# Patient Record
Sex: Male | Born: 1935 | Race: White | Hispanic: No | Marital: Married | Smoking: Former smoker
Health system: Southern US, Community
[De-identification: ages and names within clinical notes are randomized; demographics above are authoritative.]

## PROBLEM LIST (undated history)

## (undated) DIAGNOSIS — Z8673 Personal history of transient ischemic attack (TIA), and cerebral infarction without residual deficits: Secondary | ICD-10-CM

## (undated) DIAGNOSIS — I10 Essential (primary) hypertension: Secondary | ICD-10-CM

## (undated) DIAGNOSIS — I480 Paroxysmal atrial fibrillation: Secondary | ICD-10-CM

## (undated) DIAGNOSIS — I639 Cerebral infarction, unspecified: Secondary | ICD-10-CM

## (undated) DIAGNOSIS — M353 Polymyalgia rheumatica: Secondary | ICD-10-CM

## (undated) DIAGNOSIS — E785 Hyperlipidemia, unspecified: Secondary | ICD-10-CM

## (undated) DIAGNOSIS — S72001A Fracture of unspecified part of neck of right femur, initial encounter for closed fracture: Secondary | ICD-10-CM

## (undated) DIAGNOSIS — E876 Hypokalemia: Secondary | ICD-10-CM

## (undated) DIAGNOSIS — R296 Repeated falls: Secondary | ICD-10-CM

## (undated) DIAGNOSIS — I4891 Unspecified atrial fibrillation: Secondary | ICD-10-CM

## (undated) HISTORY — DX: Fracture of unspecified part of neck of right femur, initial encounter for closed fracture: S72.001A

## (undated) HISTORY — DX: Paroxysmal atrial fibrillation: I48.0

## (undated) HISTORY — DX: Hypokalemia: E87.6

## (undated) HISTORY — DX: Cerebral infarction, unspecified: I63.9

## (undated) HISTORY — PX: HEMORRHOID SURGERY: SHX153

## (undated) HISTORY — DX: Essential (primary) hypertension: I10

## (undated) HISTORY — DX: Repeated falls: R29.6

## (undated) HISTORY — DX: Unspecified atrial fibrillation: I48.91

## (undated) HISTORY — DX: Personal history of transient ischemic attack (TIA), and cerebral infarction without residual deficits: Z86.73

## (undated) HISTORY — DX: Polymyalgia rheumatica: M35.3

## (undated) HISTORY — DX: Hyperlipidemia, unspecified: E78.5

---

## 1998-11-01 ENCOUNTER — Other Ambulatory Visit: Admission: RE | Admit: 1998-11-01 | Discharge: 1998-11-01 | Payer: Self-pay | Admitting: Gastroenterology

## 1999-05-12 ENCOUNTER — Encounter: Payer: Self-pay | Admitting: Urology

## 1999-05-12 ENCOUNTER — Ambulatory Visit (HOSPITAL_COMMUNITY): Admission: RE | Admit: 1999-05-12 | Discharge: 1999-05-12 | Payer: Self-pay | Admitting: Urology

## 2007-01-28 ENCOUNTER — Ambulatory Visit: Payer: Self-pay | Admitting: Gastroenterology

## 2007-03-07 ENCOUNTER — Ambulatory Visit: Payer: Self-pay | Admitting: Gastroenterology

## 2007-03-07 ENCOUNTER — Encounter: Payer: Self-pay | Admitting: Gastroenterology

## 2007-05-15 ENCOUNTER — Ambulatory Visit: Payer: Self-pay | Admitting: Gastroenterology

## 2007-05-15 LAB — CONVERTED CEMR LAB
Fecal Occult Blood: NEGATIVE
OCCULT 2: NEGATIVE
OCCULT 4: NEGATIVE
OCCULT 5: NEGATIVE

## 2007-11-30 DIAGNOSIS — Z9889 Other specified postprocedural states: Secondary | ICD-10-CM | POA: Insufficient documentation

## 2007-11-30 DIAGNOSIS — I6350 Cerebral infarction due to unspecified occlusion or stenosis of unspecified cerebral artery: Secondary | ICD-10-CM

## 2007-11-30 DIAGNOSIS — D126 Benign neoplasm of colon, unspecified: Secondary | ICD-10-CM | POA: Insufficient documentation

## 2007-11-30 DIAGNOSIS — I10 Essential (primary) hypertension: Secondary | ICD-10-CM

## 2007-11-30 DIAGNOSIS — K5730 Diverticulosis of large intestine without perforation or abscess without bleeding: Secondary | ICD-10-CM | POA: Insufficient documentation

## 2007-11-30 DIAGNOSIS — M1990 Unspecified osteoarthritis, unspecified site: Secondary | ICD-10-CM | POA: Insufficient documentation

## 2007-11-30 DIAGNOSIS — G459 Transient cerebral ischemic attack, unspecified: Secondary | ICD-10-CM | POA: Insufficient documentation

## 2007-11-30 DIAGNOSIS — Z87442 Personal history of urinary calculi: Secondary | ICD-10-CM | POA: Insufficient documentation

## 2007-11-30 DIAGNOSIS — Z8711 Personal history of peptic ulcer disease: Secondary | ICD-10-CM

## 2007-11-30 DIAGNOSIS — Z9089 Acquired absence of other organs: Secondary | ICD-10-CM | POA: Insufficient documentation

## 2009-03-15 ENCOUNTER — Inpatient Hospital Stay (HOSPITAL_COMMUNITY): Admission: RE | Admit: 2009-03-15 | Discharge: 2009-03-18 | Payer: Self-pay | Admitting: Orthopedic Surgery

## 2009-04-15 ENCOUNTER — Encounter: Admission: RE | Admit: 2009-04-15 | Discharge: 2009-05-11 | Payer: Self-pay | Admitting: Orthopedic Surgery

## 2009-08-07 HISTORY — PX: TOTAL KNEE ARTHROPLASTY: SHX125

## 2009-08-30 ENCOUNTER — Ambulatory Visit: Payer: Self-pay | Admitting: Vascular Surgery

## 2009-08-30 ENCOUNTER — Encounter (INDEPENDENT_AMBULATORY_CARE_PROVIDER_SITE_OTHER): Payer: Self-pay | Admitting: Internal Medicine

## 2009-08-30 ENCOUNTER — Inpatient Hospital Stay (HOSPITAL_COMMUNITY): Admission: EM | Admit: 2009-08-30 | Discharge: 2009-09-01 | Payer: Self-pay | Admitting: Emergency Medicine

## 2009-08-31 ENCOUNTER — Encounter (INDEPENDENT_AMBULATORY_CARE_PROVIDER_SITE_OTHER): Payer: Self-pay | Admitting: Internal Medicine

## 2009-10-18 ENCOUNTER — Encounter: Admission: RE | Admit: 2009-10-18 | Discharge: 2010-01-16 | Payer: Self-pay | Admitting: Obstetrics and Gynecology

## 2009-11-03 ENCOUNTER — Ambulatory Visit (HOSPITAL_COMMUNITY): Admission: RE | Admit: 2009-11-03 | Discharge: 2009-11-03 | Payer: Self-pay | Admitting: Family Medicine

## 2010-02-14 ENCOUNTER — Encounter (INDEPENDENT_AMBULATORY_CARE_PROVIDER_SITE_OTHER): Payer: Self-pay | Admitting: *Deleted

## 2010-03-14 ENCOUNTER — Encounter: Admission: RE | Admit: 2010-03-14 | Discharge: 2010-04-04 | Payer: Self-pay | Admitting: Obstetrics and Gynecology

## 2010-03-24 ENCOUNTER — Encounter: Payer: Self-pay | Admitting: Emergency Medicine

## 2010-03-24 ENCOUNTER — Inpatient Hospital Stay (HOSPITAL_COMMUNITY): Admission: AD | Admit: 2010-03-24 | Discharge: 2010-03-29 | Payer: Self-pay | Admitting: Neurology

## 2010-03-28 ENCOUNTER — Ambulatory Visit: Payer: Self-pay | Admitting: Physical Medicine & Rehabilitation

## 2010-05-23 ENCOUNTER — Encounter: Admission: RE | Admit: 2010-05-23 | Discharge: 2010-05-23 | Payer: Self-pay | Admitting: Neurology

## 2010-09-06 NOTE — Letter (Signed)
Summary: Endoscopy- Changed to Office Visit  [facility]  [address] [place], [place] [address]   Phone: [phone]  Fax: [phone]      February 14, 2010 MRN: [id number]   Frank Horn [address] [place], [place]  [address]   Dear [name],   According to our records, it is time for you to schedule an Endoscopy. However, after reviewing your medical record, I feel that an office visit would be most appropriate to more completely evaluate you and determine your need for a repeat procedure.  Please call [phone] (option #2) at your convenience to schedule an office visit. If you have any questions, concerns, or feel that this letter is in error, we would appreciate your call.   Sincerely,  Barbette Hair. Arlyce Dice, M.D.  [facility] [phone]

## 2010-10-21 LAB — CK TOTAL AND CKMB (NOT AT ARMC): Relative Index: INVALID (ref 0.0–2.5)

## 2010-10-21 LAB — GLUCOSE, CAPILLARY
Glucose-Capillary: 102 mg/dL — ABNORMAL HIGH (ref 70–99)
Glucose-Capillary: 109 mg/dL — ABNORMAL HIGH (ref 70–99)
Glucose-Capillary: 109 mg/dL — ABNORMAL HIGH (ref 70–99)
Glucose-Capillary: 114 mg/dL — ABNORMAL HIGH (ref 70–99)
Glucose-Capillary: 115 mg/dL — ABNORMAL HIGH (ref 70–99)
Glucose-Capillary: 120 mg/dL — ABNORMAL HIGH (ref 70–99)
Glucose-Capillary: 147 mg/dL — ABNORMAL HIGH (ref 70–99)
Glucose-Capillary: 90 mg/dL (ref 70–99)
Glucose-Capillary: 93 mg/dL (ref 70–99)
Glucose-Capillary: 95 mg/dL (ref 70–99)

## 2010-10-21 LAB — COMPREHENSIVE METABOLIC PANEL
Alkaline Phosphatase: 67 U/L (ref 39–117)
BUN: 10 mg/dL (ref 6–23)
CO2: 33 mEq/L — ABNORMAL HIGH (ref 19–32)
Calcium: 9.4 mg/dL (ref 8.4–10.5)
Creatinine, Ser: 0.96 mg/dL (ref 0.4–1.5)
GFR calc Af Amer: >60 mL/min (ref >60)
Glucose, Bld: 87 mg/dL (ref 70–99)
Sodium: 143 mEq/L (ref 135–145)
Total Bilirubin: 0.7 mg/dL (ref 0.3–1.2)

## 2010-10-21 LAB — PROTIME-INR: Prothrombin Time: 13.7 seconds (ref 11.6–15.2)

## 2010-10-21 LAB — DIFFERENTIAL
Basophils Absolute: 0 10*3/uL (ref 0.0–0.1)
Eosinophils Absolute: 0.1 10*3/uL (ref 0.0–0.7)
Eosinophils Relative: 3 % (ref 0–5)
Monocytes Absolute: 0.4 10*3/uL (ref 0.1–1.0)
Monocytes Relative: 8 % (ref 3–12)
Neutrophils Relative %: 66 % (ref 43–77)

## 2010-10-21 LAB — CBC
MCH: 32.3 pg (ref 26.0–34.0)
MCHC: 35.3 g/dL (ref 30.0–36.0)
Platelets: 182 10*3/uL (ref 150–400)
WBC: 5.3 10*3/uL (ref 4.0–10.5)

## 2010-10-21 LAB — URINALYSIS, ROUTINE W REFLEX MICROSCOPIC: Specific Gravity, Urine: 1.01 (ref 1.005–1.030)

## 2010-10-21 LAB — APTT: aPTT: 39 seconds — ABNORMAL HIGH (ref 24–37)

## 2010-10-21 LAB — OSMOLALITY: Osmolality: 290 mOsm/kg (ref 275–300)

## 2010-10-21 LAB — TROPONIN I: Troponin I: 0.01 ng/mL (ref 0.00–0.06)

## 2010-10-21 LAB — MRSA PCR SCREENING: MRSA by PCR: NEGATIVE

## 2010-10-23 LAB — POCT I-STAT, CHEM 8
BUN: 12 mg/dL (ref 6–23)
Chloride: 108 mEq/L (ref 96–112)
Creatinine, Ser: 0.7 mg/dL (ref 0.4–1.5)
Potassium: 3.8 mEq/L (ref 3.5–5.1)
Sodium: 142 mEq/L (ref 135–145)

## 2010-10-23 LAB — TSH
TSH: 2.244 u[IU]/mL (ref 0.350–4.500)
TSH: 2.837 u[IU]/mL (ref 0.350–4.500)

## 2010-10-23 LAB — CBC
HCT: 38.2 % — ABNORMAL LOW (ref 39.0–52.0)
HCT: 40.7 % (ref 39.0–52.0)
Hemoglobin: 12.9 g/dL — ABNORMAL LOW (ref 13.0–17.0)
Hemoglobin: 13.8 g/dL (ref 13.0–17.0)
MCV: 90 fL (ref 78.0–100.0)
RBC: 4.24 MIL/uL (ref 4.22–5.81)
RDW: 14.1 % (ref 11.5–15.5)
WBC: 5 10*3/uL (ref 4.0–10.5)

## 2010-10-23 LAB — DIFFERENTIAL
Basophils Absolute: 0 10*3/uL (ref 0.0–0.1)
Basophils Absolute: 0 10*3/uL (ref 0.0–0.1)
Basophils Relative: 0 % (ref 0–1)
Eosinophils Relative: 3 % (ref 0–5)
Eosinophils Relative: 3 % (ref 0–5)
Lymphocytes Relative: 25 % (ref 12–46)
Lymphocytes Relative: 28 % (ref 12–46)
Lymphs Abs: 1.3 10*3/uL (ref 0.7–4.0)
Monocytes Absolute: 0.4 10*3/uL (ref 0.1–1.0)
Monocytes Relative: 8 % (ref 3–12)
Neutro Abs: 3.1 10*3/uL (ref 1.7–7.7)

## 2010-10-23 LAB — HEMOGLOBIN A1C: Hgb A1c MFr Bld: 5.5 % (ref 4.6–6.1)

## 2010-10-23 LAB — COMPREHENSIVE METABOLIC PANEL
Alkaline Phosphatase: 72 U/L (ref 39–117)
BUN: 10 mg/dL (ref 6–23)
CO2: 27 mEq/L (ref 19–32)
Chloride: 108 mEq/L (ref 96–112)
Creatinine, Ser: 0.9 mg/dL (ref 0.4–1.5)
GFR calc non Af Amer: >60 mL/min (ref >60)
Potassium: 3.6 mEq/L (ref 3.5–5.1)
Total Bilirubin: 0.9 mg/dL (ref 0.3–1.2)

## 2010-10-23 LAB — LIPID PANEL
Triglycerides: 280 mg/dL — ABNORMAL HIGH (ref <150)
VLDL: 56 mg/dL — ABNORMAL HIGH (ref 0–40)

## 2010-11-12 LAB — CBC
HCT: 27.3 % — ABNORMAL LOW (ref 39.0–52.0)
HCT: 32.1 % — ABNORMAL LOW (ref 39.0–52.0)
Hemoglobin: 11 g/dL — ABNORMAL LOW (ref 13.0–17.0)
MCHC: 34.3 g/dL (ref 30.0–36.0)
MCHC: 34.3 g/dL (ref 30.0–36.0)
MCHC: 34.4 g/dL (ref 30.0–36.0)
MCHC: 34.6 g/dL (ref 30.0–36.0)
MCV: 90.5 fL (ref 78.0–100.0)
MCV: 90.5 fL (ref 78.0–100.0)
Platelets: 159 10*3/uL (ref 150–400)
RBC: 3.17 MIL/uL — ABNORMAL LOW (ref 4.22–5.81)
RBC: 4.49 MIL/uL (ref 4.22–5.81)
RDW: 14 % (ref 11.5–15.5)
RDW: 14.2 % (ref 11.5–15.5)
RDW: 14.2 % (ref 11.5–15.5)

## 2010-11-12 LAB — URINALYSIS, ROUTINE W REFLEX MICROSCOPIC
Bilirubin Urine: NEGATIVE
Ketones, ur: NEGATIVE mg/dL
Nitrite: NEGATIVE
Protein, ur: NEGATIVE mg/dL
Urobilinogen, UA: 0.2 mg/dL (ref 0.0–1.0)

## 2010-11-12 LAB — BASIC METABOLIC PANEL
BUN: 9 mg/dL (ref 6–23)
CO2: 27 mEq/L (ref 19–32)
CO2: 30 mEq/L (ref 19–32)
CO2: 30 mEq/L (ref 19–32)
CO2: 31 mEq/L (ref 19–32)
Calcium: 9.4 mg/dL (ref 8.4–10.5)
Chloride: 104 mEq/L (ref 96–112)
Chloride: 104 mEq/L (ref 96–112)
Creatinine, Ser: 0.82 mg/dL (ref 0.4–1.5)
Creatinine, Ser: 0.97 mg/dL (ref 0.4–1.5)
GFR calc Af Amer: >60 mL/min (ref >60)
GFR calc non Af Amer: >60 mL/min (ref >60)
GFR calc non Af Amer: >60 mL/min (ref >60)
Glucose, Bld: 123 mg/dL — ABNORMAL HIGH (ref 70–99)
Glucose, Bld: 146 mg/dL — ABNORMAL HIGH (ref 70–99)
Potassium: 3.5 mEq/L (ref 3.5–5.1)
Potassium: 3.9 mEq/L (ref 3.5–5.1)
Sodium: 140 mEq/L (ref 135–145)

## 2010-11-12 LAB — PROTIME-INR
INR: 1.1 (ref 0.00–1.49)
Prothrombin Time: 19.8 seconds — ABNORMAL HIGH (ref 11.6–15.2)

## 2010-11-12 LAB — DIFFERENTIAL
Basophils Absolute: 0 10*3/uL (ref 0.0–0.1)
Basophils Relative: 0 % (ref 0–1)
Neutro Abs: 3.9 10*3/uL (ref 1.7–7.7)
Neutrophils Relative %: 67 % (ref 43–77)

## 2010-11-12 LAB — APTT: aPTT: 34 seconds (ref 24–37)

## 2010-12-20 NOTE — Discharge Summary (Signed)
NAMEBURDETT, PINZON               ACCOUNT NO.:  [id number]   MEDICAL RECORD NO.:  [id number]          PATIENT TYPE:  INP   LOCATION:  1615                         FACILITY:  [facility]   PHYSICIAN:  Almedia Balls. Ranell Patrick, M.D. DATE OF BIRTH:  Dec 12, 1926   DATE OF ADMISSION:  03/15/2009  DATE OF DISCHARGE:  03/18/2009                               DISCHARGE SUMMARY   ADMISSION DIAGNOSIS:  Right knee end-stage osteoarthritis.   DISCHARGE DIAGNOSIS:  Right knee end-stage osteoarthritis, status post  total knee arthroplasty.   BRIEF HISTORY:  The patient is an 77 year old male with worsening right  knee pain secondary to osteoarthritis.  Patient has elected for surgical  management for improved function and decreased pain.   PROCEDURE:  The patient had a right total knee arthroplasty by Dr.  Malon Kindle on March 15, 2009.  Assistant was [employer], PA-C.  No  complications, and estimated blood loss was minimal.   HOSPITAL COURSE:  The patient was admitted on March 15, 2009, for the  above-stated procedure which he tolerated well.  After adequate time in  post-anesthesia care unit, he was transferred up to [facility].  On  postoperative day #1, the patient complained about minimal pain and  soreness to the right lower extremity.  He was doing quite well.  He was  able to work with physical therapy quite well, had one episode of  vertigo because he moved too quickly, but blood counts were 9.8 and 28  at the time.  Neurovascularly, he was intact.  He continued to work  quite well with physical therapy until discharge date on August 12.   DISCHARGE PLAN:  The patient is being discharged to skilled nursing  facility on March 18, 2009.  His condition is stable.  His diet is  regular.  The patient has no known drug allergies.   DISCHARGE MEDICATIONS:  1. Aspirin 81 mg p.o. daily.  2. Zocor 20 mg daily.  3. Coumadin per pharmacy protocol.  4. Norco 5/325 one to 2 tabs q.4-6 h. p.r.n. pain.  5.  Robaxin 500 mg q.8 h.   FOLLOW UP:  The patient will follow back up with Dr. Malon Kindle in 2  weeks.     Thomas B. Durwin Nora, P.A.      Almedia Balls. Ranell Patrick, M.D.  Electronically Signed   TBD/MEDQ  D:  03/18/2009  T:  03/18/2009  Job:  [id number]

## 2010-12-20 NOTE — Op Note (Signed)
Frank Horn, Frank Horn               ACCOUNT NO.:  [id number]   MEDICAL RECORD NO.:  [id number]          PATIENT TYPE:  INP   LOCATION:  0007                         FACILITY:  [facility]   PHYSICIAN:  Almedia Balls. Ranell Patrick, M.D. DATE OF BIRTH:  11/14/1926   DATE OF PROCEDURE:  03/15/2009  DATE OF DISCHARGE:                               OPERATIVE REPORT   PREOPERATIVE DIAGNOSIS:  Right knee end-stage osteoarthritis.   POSTOPERATIVE DIAGNOSIS:  Right knee end-stage osteoarthritis.   PROCEDURE PERFORMED:  Right total knee replacement.   ATTENDING SURGEON:  Dr. Beverely Low.   ASSISTANT:  Modesto Charon, [place].   ANESTHESIA:  General.   ESTIMATED BLOOD LOSS:  Minimal.   TOURNIQUET TIME:  One hour and 30 minutes at 300 mmHg.   FLUID REPLACEMENT:  1600 mL of crystalloid.   URINE OUTPUT:  650 mL.   INSTRUMENT COUNT:  Correct.   COMPLICATIONS:  There were no complications.   Perioperative antibiotics were given.   INDICATIONS:  The patient is an 77 year old male with a history of  worsening right knee pain secondary to disabling osteoarthritis.  The  patient has had progressive pain despite surgical management.  The  patient presents now having failed conservative management desiring  operative treatment to restore function and eliminate pain.  Informed  consent obtained.   DESCRIPTION OF PROCEDURE:  After an adequate level of anesthesia was  achieved, the patient was positioned supine on the operating room table.  A nonsterile tourniquet was placed on the right proximal thigh.  The  right leg was sterilely prepped and draped in the usual manner.  The  patient had significant varus and a slight flexion fracture to his knee.  After sterile prep and drape of the leg, we elevated the tourniquet to  300 mmHg after exsanguination with an Esmarch bandage.  We then flexed  the knee and incised the knee in the midline using a 10 blade scalpel.  Dissection down through the subcutaneous  tissues using the scalpel.  A  fresh blade was utilized for the mini parapatellar arthrotomy.  Lateral  patellofemoral ligaments divided and the patella everted.  At this  point, we went ahead and entered the distal femur using a step-cut  drill.  We then resected 10 mm off the affected right knee set on 5  degrees right.  We then sized the femur to a size 3.  We then made our  anterior, posterior and chamfer cuts for the size 3 component.  We then  subluxed the tibia anteriorly, removed the remaining PCL, ACL tissue, as  well as meniscal tissue and then resected the tibia 90 degrees  perpendicular to the long axis of the tibia with a 0 posterior slope.  At this point with the tibial resection performed, we checked our  extension and flexion gaps which were symmetric at 12.5 mm.  We then  went ahead and removed excess posterior bone off the posterior femoral  condyles using three-quarter inch curved osteotomes.  We removed soft  tissue off from the margins of the joint to make sure that nothing would  pinch.  We then went ahead with sizing for the tibia, which was a size 4  after removal of osteophytes and then performed our modular drill and  keel punch for final tibial preparation.  We also did our box cut for  the posterior cruciate substituting prosthesis for the femur and then  went ahead and impacted trial components into place.  We used a 15  insert initially, that later grew to a 17.5 after real components were  in and we trialed again.  With the 15 polyethylene insert in, we went  ahead and placed the knee in extension.  We then went ahead and  resurfaced the patella going from 25-26 mm thickness down to 16 with the  patellar cutting guide and then used our lug drill to drill the lugs for  the patellar button.  We used a 38 button, reduced the knee and had  excellent soft tissue balancing and normal patellar tracking.  We were  stable in flexion/extension.  At this point, we went  ahead and removed  all trial components.  We plugged the end of the femur using available  bone to decrease bleeding.  We then pulse irrigated the entire knee and  then cemented the components into place with a third-generation vacuum  mixing cement techniques.  Once the cement was allowed to harden, we  removed excess cement using quarter-inch curved osteotome.  Polyethylene  trial insert had been placed in the knee and the knee placed in  extension for setting of the cement, as well as the patella being  clamped.  At this point with the excess cement removed, we trialed again  with the 15 and then a 17.5, and the 17.5 was able to achieve full  extension and we felt like that would provide overall better stability,  especially in deep flexion.  Thus, we selected a 17.5 insert and placed  the real insert.  We checked one more time for any excess cement and  none was encountered.  We pulse irrigated again and placed a 17.5  insert.  We then closed the medial parapatellar arthrotomy using #1  Vicryl suture figure-of-eight followed by 0 Vicryl and 2-0 Vicryl  layered subcutaneous closure and a 4-0 running Monocryl for skin.  Steri-  Strips were applied followed by a sterile dressing.  The patient  tolerated the surgery well.      Almedia Balls. Ranell Patrick, M.D.  Electronically Signed     SRN/MEDQ  D:  03/15/2009  T:  03/15/2009  Job:  [id number]

## 2010-12-20 NOTE — Assessment & Plan Note (Signed)
[facility]                         GASTROENTEROLOGY OFFICE NOTE   Frank, Horn                      MRN:          [id number]  DATE:01/28/2007                            DOB:          [birth year]    REASON FOR CONSULTATION:  Heme-positive stool.   Frank Horn is a pleasant 77 year old white male referred through the  courtesy of Dr. Tenny Craw for evaluation.  Routine testing demonstrated heme-  positive stool.  Frank Horn has no GI complaints including change of  bowel habits, abdominal pain, melena or hematochezia.  He is on no  gastric irritants including nonsteroidals.  He has a history of colon  polyps, diverticulosis, and a remote history of bleeding peptic ulcer  disease.  Last colonoscopy in 2004 demonstrated diverticulosis.  He has  a history of kidney stones.  He is status post hemorrhoidectomy in 1963.  CBC on Dec 26, 2006 demonstrated hemoglobin 14.5.   FAMILY HISTORY:  Noncontributory.   PAST MEDICAL HISTORY:  Also pertinent for TIAs for which he is on  Plavix.   MEDICATIONS:  Baby aspirin, Plavix, and Lovastatin.   ALLERGIES:  He has no allergies.   SOCIAL HISTORY:  He is an ex-smoker.  He does not drink.  He is married  and retired.   REVIEW OF SYSTEMS:  Positive for back pain.   PHYSICAL EXAMINATION:  VITAL SIGNS:  Pulse 60, blood pressure 120/76,  weight 156.  HEENT:  EOMI. PERRLA. Sclerae are anicteric.  Conjunctivae are pink.  NECK:  Supple without thyromegaly, adenopathy or carotid bruits.  CHEST:  Clear to auscultation and percussion without adventitious  sounds.  CARDIAC:  Regular rhythm; normal S1, S2.  There are no murmurs, gallops  or rubs.  ABDOMEN:  Bowel sounds are normoactive.  Abdomen is soft, nontender and  nondistended.  There are no abdominal masses, tenderness, splenic  enlargement or hepatomegaly.  EXTREMITIES:  Full range of motion.  No cyanosis, clubbing or edema.  RECTAL:  Deferred.  The exam is  normal.   IMPRESSION:  1. Heme-positive stool.  We are asked to rule out colonic bleeding      sources including polyps, arteriovenous malformations, and      neoplasm.  2. History of colon polyps.  3. Diverticulosis.  4. History of transient ischemic attacks - on Plavix.   RECOMMENDATION:  Colonoscopy.  If negative I will obtain followup  Hemoccults.     Barbette Hair. Arlyce Dice, MD,FACG  Electronically Signed    RDK/MedQ  DD: 01/28/2007  DT: 01/28/2007  Job #: [id number]   cc:   C. Duane Lope, M.D.

## 2010-12-23 NOTE — H&P (Signed)
Frank Horn, KRIESEL               ACCOUNT NO.:  [id number]   MEDICAL RECORD NO.:  [id number]          PATIENT TYPE:  INP   LOCATION:                               FACILITY:  [facility]   PHYSICIAN:  Almedia Balls. Ranell Patrick, M.D. DATE OF BIRTH:  04-14-1927   DATE OF ADMISSION:  03/15/2009  DATE OF DISCHARGE:                              HISTORY & PHYSICAL   CHIEF COMPLAINT:  Right knee pain.   HISTORY OF PRESENT ILLNESS:  The patient is an 77 year old male with  worsening right knee pain that has been refractory for conservative  treatment.  The patient elected for surgical management for that right  knee.   PAST MEDICAL HISTORY:  1. Vertigo.  2. Hyperlipidemia.  3. BPH.   FAMILY MEDICAL HISTORY:  Negative.   SOCIAL HISTORY:  Patient of Dr. Miguel Aschoff.  Does not smoke or use  alcohol.  Is retired.   ALLERGIES:  NONE.   MEDICATIONS:  1. Aspirin 81 mg daily.  2. Pravastatin 40 mg daily.  3. Plavix 1.75 mg daily.  4. Vitamins.   REVIEW OF SYSTEMS:  Pain with range of motion.   PHYSICAL EXAMINATION:  VITAL SIGNS:  Pulse 68, respirations 16, blood  pressure 122/74.  GENERAL:  The patient is a healthy-appearing 77 year old male in no  acute distress.  Pleasant mood and affect.  Alert and oriented x3.  HEENT:  Shows cranial nerves II-XII grossly intact with no tenderness of  cervical, thoracic or lumbar spine.  CHEST:  Active breath sounds bilaterally.  No wheezes, rhonchi or rales.  ABDOMEN:  Nontender, nondistended with active bowel sounds.  EXTREMITIES:  Moderate tenderness to the right knee, especially in the  medial joint line with crepitus with a varus alignment, but  neurovascularly, otherwise, he is intact distally.  No pedal edema.  Does have a normal heel-toe gait.   STUDIES:  X-rays show end-stage osteoarthritis right knee.   IMPRESSION:  End-stage osteoarthritis right knee.   PLAN OF ACTION:  Right total knee arthroplasty by Dr. Malon Kindle.      Thomas B. Durwin Nora,  P.A.      Almedia Balls. Ranell Patrick, M.D.  Electronically Signed    TBD/MEDQ  D:  02/24/2009  T:  02/24/2009  Job:  [id number]

## 2012-04-09 ENCOUNTER — Encounter: Payer: Self-pay | Admitting: Gastroenterology

## 2014-05-29 ENCOUNTER — Other Ambulatory Visit: Payer: Self-pay

## 2014-05-29 ENCOUNTER — Encounter: Payer: Self-pay | Admitting: Vascular Surgery

## 2014-05-29 DIAGNOSIS — M79606 Pain in leg, unspecified: Secondary | ICD-10-CM

## 2014-06-25 ENCOUNTER — Encounter: Payer: Self-pay | Admitting: Vascular Surgery

## 2014-06-26 ENCOUNTER — Encounter: Payer: Self-pay | Admitting: Vascular Surgery

## 2014-06-26 ENCOUNTER — Other Ambulatory Visit: Payer: Self-pay | Admitting: Vascular Surgery

## 2014-06-26 ENCOUNTER — Ambulatory Visit (INDEPENDENT_AMBULATORY_CARE_PROVIDER_SITE_OTHER): Payer: Medicare Other | Admitting: Vascular Surgery

## 2014-06-26 ENCOUNTER — Ambulatory Visit (HOSPITAL_COMMUNITY)
Admission: RE | Admit: 2014-06-26 | Discharge: 2014-06-26 | Disposition: A | Discharge status: Home / Self Care | Payer: Medicare Other | Source: Ambulatory Visit | Attending: Vascular Surgery | Admitting: Vascular Surgery

## 2014-06-26 VITALS — BP 177/104 | HR 78 | Resp 16 | Ht 66.5 in | Wt 149.0 lb

## 2014-06-26 DIAGNOSIS — M79604 Pain in right leg: Secondary | ICD-10-CM

## 2014-06-26 DIAGNOSIS — Z87891 Personal history of nicotine dependence: Secondary | ICD-10-CM | POA: Insufficient documentation

## 2014-06-26 DIAGNOSIS — M79605 Pain in left leg: Secondary | ICD-10-CM

## 2014-06-26 DIAGNOSIS — M79606 Pain in leg, unspecified: Secondary | ICD-10-CM

## 2014-06-26 DIAGNOSIS — I10 Essential (primary) hypertension: Secondary | ICD-10-CM | POA: Insufficient documentation

## 2014-06-26 DIAGNOSIS — I70219 Atherosclerosis of native arteries of extremities with intermittent claudication, unspecified extremity: Secondary | ICD-10-CM | POA: Insufficient documentation

## 2014-06-26 DIAGNOSIS — I739 Peripheral vascular disease, unspecified: Secondary | ICD-10-CM

## 2014-06-26 DIAGNOSIS — E785 Hyperlipidemia, unspecified: Secondary | ICD-10-CM | POA: Diagnosis not present

## 2014-06-26 NOTE — Progress Notes (Signed)
Referred by:  Melinda Crutch, MD [place],  [id number]  Reason for referral: bilateral leg pain  History of Present Illness  Frank Horn is a 80 y.o. (1926-12-01) male who presents with chief complaint: recent onset of bilateral knee pain.  Onset of symptom occurred few months ago, without any obvious trigger.  Pain is described as burning sensation, severity 3-6/10, without ambulation relationship.  He wakes up in the night time with this neuropathic pain in the knee.  Patient has attempted to treat this pain with rest.  The patient has no rest pain symptoms also and no leg wounds/ulcers.  Atherosclerotic risk factors include: HLD, HTN, and prior smoking.  Past Medical History  Diagnosis Date  . Hypertension   . Hyperlipidemia   . Stroke   . Atrial fibrillation   . Polymyalgia     resolved    Past Surgical History  Procedure Laterality Date  . Hemorrhoid surgery    . Total knee arthroplasty Right 2011    History   Social History  . Marital Status: Married    Spouse Name: N/A    Number of Children: N/A  . Years of Education: N/A   Occupational History  . Not on file.   Social History Main Topics  . Smoking status: Former Smoker    Types: Cigarettes    Quit date: 06/26/2009  . Smokeless tobacco: Never Used  . Alcohol Use: 0.0 oz/week    0 Not specified per week  . Drug Use: No  . Sexual Activity: Not on file   Other Topics Concern  . Not on file   Social History Narrative    Family History  Problem Relation Age of Onset  . Heart disease Mother   . Aneurysm Brother     Current Outpatient Prescriptions on File Prior to Visit  Medication Sig Dispense Refill  . aspirin 81 MG tablet Take 81 mg by mouth daily.    Mariane Baumgarten Sodium (COLACE PO) Take 100 mg by mouth as needed.    . IBUPROFEN PO Take 1 tablet by mouth as needed.    . Multiple Vitamin (MULTIVITAMIN) tablet Take 1 tablet by mouth daily.    . pravastatin (PRAVACHOL) 80 MG  tablet Take 80 mg by mouth daily.    . ramipril (ALTACE) 2.5 MG capsule Take 2.5 mg by mouth daily.     No current facility-administered medications on file prior to visit.    Allergies  Allergen Reactions  . Lipitor [Atorvastatin]     myalgias    REVIEW OF SYSTEMS:  (Positives checked otherwise negative)  CARDIOVASCULAR:  [ ]  chest pain, [ ]  chest pressure, [ ]  palpitations, [ ]  shortness of breath when laying flat, [ ]  shortness of breath with exertion,   [ ]  pain in feet when walking, [ ]  pain in feet when laying flat, [ ]  history of blood clot in veins (DVT), [ ]  history of phlebitis, [ ]  swelling in legs, [ ]  varicose veins  PULMONARY:  [ ]  productive cough, [ ]  asthma, [ ]  wheezing  NEUROLOGIC:  [ ]  weakness in arms or legs, [ ]  numbness in arms or legs, [ ]  difficulty speaking or slurred speech, [ ]  temporary loss of vision in one eye, [ ]  dizziness  HEMATOLOGIC:  [ ]  bleeding problems, [ ]  problems with blood clotting too easily  MUSCULOSKEL:  [ ]  joint pain, [ ]  joint swelling  GASTROINTEST:  [ ]   Vomiting  blood, [ ]   Blood in stool     GENITOURINARY:  [ ]   Burning with urination, [ ]   Blood in urine  PSYCHIATRIC:  [ ]  history of major depression  INTEGUMENTARY:  [ ]  rashes, [ ]  ulcers  CONSTITUTIONAL:  [ ]  fever, [ ]  chills  For VQI Use Only   PRE-ADM LIVING: Home  AMB STATUS: Ambulatory  CAD Sx: None  PRIOR CHF: None  STRESS TEST: [x]  No, [ ]  Normal, [ ]  + ischemia, [ ]  + MI, [ ]  Both  Physical Examination Filed Vitals:   06/26/14 1118  BP: 177/104  Pulse: 78  Resp: 16  Height: 5' 6.5" (1.689 m)  Weight: 149 lb (67.586 kg)   Body mass index is 23.69 kg/(m^2).  General: A&O x 3, WDWN, elderly  Head: [place]/AT  Ear/Nose/Throat: Hearing grossly intact, nares w/o erythema or drainage, oropharynx w/o Erythema/Exudate  Eyes: PERRLA, EOMI  Neck: Supple, no nuchal rigidity, no palpable LAD  Pulmonary: Sym exp, good air movt, CTAB, no rales, rhonchi,  & wheezing  Cardiac: RRR, Nl S1, S2, no Murmurs, rubs or gallops  Vascular: Vessel Right Left  Radial Palpable Palpable  Brachial  Palpable Palpable  Carotid Palpable, without bruit Palpable, without bruit  Aorta Not palpable N/A  Femoral Palpable Palpable  Popliteal Not palpable Not palpable  PT Faintly Palpable Faintly Palpable  DP Palpable Palpable   Gastrointestinal: soft, NTND, -G/R, - HSM, - masses, - CVAT B  Musculoskeletal: M/S 5/5 throughout , Extremities without ischemic changes , B cyanotic feet which resolve with elevation  Neurologic: CN 2-12 intact , Pain and light touch intact in extremities , Motor exam as listed above  Psychiatric: Judgment intact, Mood & affect appropriatefor pt's clinical situation  Dermatologic: See M/S exam for extremity exam, no rashes otherwise noted  Lymph : No Cervical, Axillary, or Inguinal lymphadenopathy   Non-Invasive Vascular Imaging  ABI (Date: 06/26/2014)  R: 0.76, DP: tri, PT: mono, TBI: 0.76  L: 0.90, DP: tri, PT: bi, TBI: 0.90  Outside Studies/Documentation 2 pages of outside documents were reviewed including: outpatient PCP chart.  Medical Decision Making  Frank Horn is a 80 y.o. male who presents with: likely neuropathic pain, mild LLE PAD, R mild-mod PAD, likely chronic venous insufficiency    Pt's sx are not typical for PAD. I doubt PAD is responsible his sx though he does has some degree of PAD.  His TBI are normal so at the toe level his flow is adequate.  No intervention is likely to be necessary in this patient.    Pt's ABI put him in the intermittent claudication range.  I discussed with the patient the natural history of intermittent claudication: 75% of patients have stable or improved symptoms in a year an only 2% require amputation. Eventually 20% may require intervention in a year.  I discussed in depth with the patient the nature of atherosclerosis, and emphasized the importance of maximal medical  management including strict control of blood pressure, blood glucose, and lipid levels, antiplatelet agent, obtaining regular exercise, and cessation of smoking.    The patient is aware that without maximal medical management the underlying atherosclerotic disease process will progress, limiting the benefit of any interventions.  I discussed in depth with the patient a walking plan and how to execute such. The patient is currently on a statin: due to myalgias. The patient is currently on an anti-platelet: ASA. This pateitn can follow up with [place] as needed.  Thank  you for allowing [place] to participate in this patient's care.  Adele Barthel, MD [facility] Office: [phone] Pager: [phone]  06/26/2014, 11:49 AM

## 2016-11-05 DIAGNOSIS — S72001A Fracture of unspecified part of neck of right femur, initial encounter for closed fracture: Secondary | ICD-10-CM

## 2016-11-05 HISTORY — DX: Fracture of unspecified part of neck of right femur, initial encounter for closed fracture: S72.001A

## 2016-11-18 ENCOUNTER — Encounter (HOSPITAL_COMMUNITY): Payer: Self-pay | Admitting: Emergency Medicine

## 2016-11-18 ENCOUNTER — Emergency Department (HOSPITAL_COMMUNITY): Payer: Medicare Other

## 2016-11-18 ENCOUNTER — Inpatient Hospital Stay (HOSPITAL_COMMUNITY): Payer: Medicare Other | Admitting: Anesthesiology

## 2016-11-18 ENCOUNTER — Inpatient Hospital Stay (HOSPITAL_COMMUNITY): Payer: Medicare Other

## 2016-11-18 ENCOUNTER — Inpatient Hospital Stay (HOSPITAL_COMMUNITY)
Admission: EM | Admit: 2016-11-18 | Discharge: 2016-11-20 | DRG: 470 | Disposition: A | Discharge status: Skilled Nursing Facility | Payer: Medicare Other | Attending: Internal Medicine | Admitting: Internal Medicine

## 2016-11-18 ENCOUNTER — Encounter (HOSPITAL_COMMUNITY)
Admission: EM | Disposition: A | Discharge status: Skilled Nursing Facility | Payer: Self-pay | Source: Home / Self Care | Attending: Internal Medicine

## 2016-11-18 DIAGNOSIS — Z8673 Personal history of transient ischemic attack (TIA), and cerebral infarction without residual deficits: Secondary | ICD-10-CM | POA: Diagnosis not present

## 2016-11-18 DIAGNOSIS — Z7982 Long term (current) use of aspirin: Secondary | ICD-10-CM | POA: Diagnosis not present

## 2016-11-18 DIAGNOSIS — F0390 Unspecified dementia without behavioral disturbance: Secondary | ICD-10-CM | POA: Diagnosis present

## 2016-11-18 DIAGNOSIS — Z79899 Other long term (current) drug therapy: Secondary | ICD-10-CM | POA: Diagnosis not present

## 2016-11-18 DIAGNOSIS — G9389 Other specified disorders of brain: Secondary | ICD-10-CM | POA: Diagnosis present

## 2016-11-18 DIAGNOSIS — I480 Paroxysmal atrial fibrillation: Secondary | ICD-10-CM

## 2016-11-18 DIAGNOSIS — M353 Polymyalgia rheumatica: Secondary | ICD-10-CM | POA: Diagnosis present

## 2016-11-18 DIAGNOSIS — W19XXXA Unspecified fall, initial encounter: Secondary | ICD-10-CM | POA: Diagnosis not present

## 2016-11-18 DIAGNOSIS — D62 Acute posthemorrhagic anemia: Secondary | ICD-10-CM | POA: Diagnosis not present

## 2016-11-18 DIAGNOSIS — S72031A Displaced midcervical fracture of right femur, initial encounter for closed fracture: Secondary | ICD-10-CM | POA: Diagnosis present

## 2016-11-18 DIAGNOSIS — Z87891 Personal history of nicotine dependence: Secondary | ICD-10-CM | POA: Diagnosis not present

## 2016-11-18 DIAGNOSIS — E876 Hypokalemia: Secondary | ICD-10-CM

## 2016-11-18 DIAGNOSIS — Z419 Encounter for procedure for purposes other than remedying health state, unspecified: Secondary | ICD-10-CM | POA: Diagnosis not present

## 2016-11-18 DIAGNOSIS — E785 Hyperlipidemia, unspecified: Secondary | ICD-10-CM | POA: Diagnosis present

## 2016-11-18 DIAGNOSIS — S72001A Fracture of unspecified part of neck of right femur, initial encounter for closed fracture: Secondary | ICD-10-CM | POA: Diagnosis present

## 2016-11-18 DIAGNOSIS — Z96641 Presence of right artificial hip joint: Secondary | ICD-10-CM

## 2016-11-18 DIAGNOSIS — W010XXA Fall on same level from slipping, tripping and stumbling without subsequent striking against object, initial encounter: Secondary | ICD-10-CM | POA: Diagnosis present

## 2016-11-18 DIAGNOSIS — Z96651 Presence of right artificial knee joint: Secondary | ICD-10-CM | POA: Diagnosis present

## 2016-11-18 DIAGNOSIS — R296 Repeated falls: Secondary | ICD-10-CM | POA: Diagnosis not present

## 2016-11-18 DIAGNOSIS — Y92009 Unspecified place in unspecified non-institutional (private) residence as the place of occurrence of the external cause: Secondary | ICD-10-CM

## 2016-11-18 DIAGNOSIS — R740 Nonspecific elevation of levels of transaminase and lactic acid dehydrogenase [LDH]: Secondary | ICD-10-CM | POA: Diagnosis not present

## 2016-11-18 DIAGNOSIS — I10 Essential (primary) hypertension: Secondary | ICD-10-CM | POA: Diagnosis present

## 2016-11-18 DIAGNOSIS — Z66 Do not resuscitate: Secondary | ICD-10-CM | POA: Diagnosis present

## 2016-11-18 DIAGNOSIS — M25551 Pain in right hip: Secondary | ICD-10-CM | POA: Diagnosis present

## 2016-11-18 HISTORY — PX: TOTAL HIP ARTHROPLASTY: SHX124

## 2016-11-18 LAB — CBC WITH DIFFERENTIAL/PLATELET
Basophils Absolute: 0 10*3/uL (ref 0.0–0.1)
Basophils Relative: 0 %
EOS PCT: 0 %
Eosinophils Absolute: 0 10*3/uL (ref 0.0–0.7)
HEMATOCRIT: 37.2 % — AB (ref 39.0–52.0)
Hemoglobin: 12.6 g/dL — ABNORMAL LOW (ref 13.0–17.0)
LYMPHS ABS: 0.6 10*3/uL — AB (ref 0.7–4.0)
LYMPHS PCT: 7 %
MCH: 29.6 pg (ref 26.0–34.0)
MCHC: 33.9 g/dL (ref 30.0–36.0)
MCV: 87.5 fL (ref 78.0–100.0)
MONO ABS: 0.6 10*3/uL (ref 0.1–1.0)
Monocytes Relative: 7 %
NEUTROS ABS: 7.4 10*3/uL (ref 1.7–7.7)
Neutrophils Relative %: 86 %
PLATELETS: 168 10*3/uL (ref 150–400)
RBC: 4.25 MIL/uL (ref 4.22–5.81)
RDW: 13.8 % (ref 11.5–15.5)
WBC: 8.5 10*3/uL (ref 4.0–10.5)

## 2016-11-18 LAB — TYPE AND SCREEN
ABO/RH(D): O POS
ANTIBODY SCREEN: NEGATIVE

## 2016-11-18 LAB — BASIC METABOLIC PANEL
ANION GAP: 7 (ref 5–15)
BUN: 13 mg/dL (ref 6–20)
CHLORIDE: 105 mmol/L (ref 101–111)
CO2: 28 mmol/L (ref 22–32)
Calcium: 9.3 mg/dL (ref 8.9–10.3)
Creatinine, Ser: 0.89 mg/dL (ref 0.61–1.24)
GFR calc Af Amer: >60 mL/min (ref >60)
GFR calc non Af Amer: >60 mL/min (ref >60)
Glucose, Bld: 133 mg/dL — ABNORMAL HIGH (ref 65–99)
POTASSIUM: 3.4 mmol/L — AB (ref 3.5–5.1)
Sodium: 140 mmol/L (ref 135–145)

## 2016-11-18 LAB — PROTIME-INR
INR: 1.19
Prothrombin Time: 15.1 seconds (ref 11.4–15.2)

## 2016-11-18 SURGERY — ARTHROPLASTY, HIP, TOTAL, ANTERIOR APPROACH
Anesthesia: General | Site: Hip | Laterality: Right

## 2016-11-18 MED ORDER — ACETAMINOPHEN 650 MG RE SUPP: 650.0000 mg | Freq: Four times a day (QID) | RECTAL | Status: DC | PRN

## 2016-11-18 MED ORDER — MENTHOL 3 MG MT LOZG
1.0000 | LOZENGE | OROMUCOSAL | Status: DC | PRN
  Filled 2016-11-18: qty 9

## 2016-11-18 MED ORDER — ALUM & MAG HYDROXIDE-SIMETH 200-200-20 MG/5ML PO SUSP: 30.0000 mL | ORAL | Status: DC | PRN

## 2016-11-18 MED ORDER — HEPARIN SODIUM (PORCINE) 5000 UNIT/ML IJ SOLN: 5000.0000 [IU] | Freq: Three times a day (TID) | INTRAMUSCULAR | Status: DC

## 2016-11-18 MED ORDER — LIDOCAINE 2% (20 MG/ML) 5 ML SYRINGE
INTRAMUSCULAR | Status: DC | PRN
  Administered 2016-11-18: 40 mg via INTRAVENOUS

## 2016-11-18 MED ORDER — ROCURONIUM BROMIDE 10 MG/ML (PF) SYRINGE
PREFILLED_SYRINGE | INTRAVENOUS | Status: DC | PRN
  Administered 2016-11-18: 50 mg via INTRAVENOUS

## 2016-11-18 MED ORDER — SODIUM CHLORIDE 0.9 % IV SOLN
INTRAVENOUS | Status: DC
  Administered 2016-11-18 – 2016-11-20 (×4): via INTRAVENOUS

## 2016-11-18 MED ORDER — PHENOL 1.4 % MT LIQD
1.0000 | OROMUCOSAL | Status: DC | PRN
  Filled 2016-11-18: qty 177

## 2016-11-18 MED ORDER — SUGAMMADEX SODIUM 200 MG/2ML IV SOLN
INTRAVENOUS | Status: DC | PRN
  Administered 2016-11-18: 150 mg via INTRAVENOUS

## 2016-11-18 MED ORDER — ASPIRIN EC 325 MG PO TBEC
325.0000 mg | DELAYED_RELEASE_TABLET | Freq: Every day | ORAL | Status: DC
  Administered 2016-11-19 – 2016-11-20 (×2): 325 mg via ORAL
  Filled 2016-11-18 (×2): qty 1

## 2016-11-18 MED ORDER — MORPHINE SULFATE (PF) 2 MG/ML IV SOLN
0.5000 mg | INTRAVENOUS | Status: DC | PRN
  Administered 2016-11-18 – 2016-11-20 (×9): 0.5 mg via INTRAVENOUS
  Filled 2016-11-18 (×9): qty 1

## 2016-11-18 MED ORDER — MIDAZOLAM HCL 2 MG/2ML IJ SOLN
INTRAMUSCULAR | Status: AC
  Filled 2016-11-18: qty 2

## 2016-11-18 MED ORDER — STERILE WATER FOR IRRIGATION IR SOLN
Status: DC | PRN
  Administered 2016-11-18: 2000 mL

## 2016-11-18 MED ORDER — DOCUSATE SODIUM 100 MG PO CAPS
100.0000 mg | ORAL_CAPSULE | Freq: Two times a day (BID) | ORAL | Status: DC
  Administered 2016-11-18 – 2016-11-20 (×4): 100 mg via ORAL
  Filled 2016-11-18 (×4): qty 1

## 2016-11-18 MED ORDER — CEFAZOLIN SODIUM-DEXTROSE 2-4 GM/100ML-% IV SOLN
2.0000 g | Freq: Four times a day (QID) | INTRAVENOUS | Status: AC
  Administered 2016-11-18 – 2016-11-19 (×2): 2 g via INTRAVENOUS
  Filled 2016-11-18 (×2): qty 100

## 2016-11-18 MED ORDER — ROCURONIUM BROMIDE 50 MG/5ML IV SOSY
PREFILLED_SYRINGE | INTRAVENOUS | Status: AC
  Filled 2016-11-18: qty 5

## 2016-11-18 MED ORDER — ONDANSETRON HCL 4 MG PO TABS: 4.0000 mg | ORAL_TABLET | Freq: Four times a day (QID) | ORAL | Status: DC | PRN

## 2016-11-18 MED ORDER — SODIUM CHLORIDE 0.9 % IR SOLN
Status: DC | PRN
  Administered 2016-11-18: 1000 mL

## 2016-11-18 MED ORDER — CEFAZOLIN SODIUM-DEXTROSE 2-4 GM/100ML-% IV SOLN
INTRAVENOUS | Status: AC
  Filled 2016-11-18: qty 100

## 2016-11-18 MED ORDER — PHENYLEPHRINE 40 MCG/ML (10ML) SYRINGE FOR IV PUSH (FOR BLOOD PRESSURE SUPPORT)
PREFILLED_SYRINGE | INTRAVENOUS | Status: DC | PRN
  Administered 2016-11-18 (×2): 80 ug via INTRAVENOUS
  Administered 2016-11-18: 160 ug via INTRAVENOUS
  Administered 2016-11-18: 80 ug via INTRAVENOUS

## 2016-11-18 MED ORDER — ONDANSETRON HCL 4 MG/2ML IJ SOLN
INTRAMUSCULAR | Status: DC | PRN
  Administered 2016-11-18: 4 mg via INTRAVENOUS

## 2016-11-18 MED ORDER — ACETAMINOPHEN 325 MG PO TABS: 650.0000 mg | ORAL_TABLET | Freq: Four times a day (QID) | ORAL | Status: DC | PRN

## 2016-11-18 MED ORDER — ONDANSETRON HCL 4 MG/2ML IJ SOLN: 4.0000 mg | Freq: Four times a day (QID) | INTRAMUSCULAR | Status: DC | PRN

## 2016-11-18 MED ORDER — FENTANYL CITRATE (PF) 100 MCG/2ML IJ SOLN
INTRAMUSCULAR | Status: AC
  Filled 2016-11-18: qty 2

## 2016-11-18 MED ORDER — FENTANYL CITRATE (PF) 100 MCG/2ML IJ SOLN
50.0000 ug | INTRAMUSCULAR | Status: AC | PRN
  Administered 2016-11-18 – 2016-11-19 (×2): 50 ug via INTRAVENOUS
  Filled 2016-11-18 (×2): qty 2

## 2016-11-18 MED ORDER — FENTANYL CITRATE (PF) 100 MCG/2ML IJ SOLN
INTRAMUSCULAR | Status: DC | PRN
  Administered 2016-11-18 (×4): 50 ug via INTRAVENOUS

## 2016-11-18 MED ORDER — FENTANYL CITRATE (PF) 100 MCG/2ML IJ SOLN: 25.0000 ug | INTRAMUSCULAR | Status: DC | PRN

## 2016-11-18 MED ORDER — ONDANSETRON HCL 4 MG/2ML IJ SOLN
INTRAMUSCULAR | Status: AC
  Filled 2016-11-18: qty 2

## 2016-11-18 MED ORDER — SODIUM CHLORIDE 0.9 % IV SOLN: INTRAVENOUS | Status: DC

## 2016-11-18 MED ORDER — HEPARIN SODIUM (PORCINE) 5000 UNIT/ML IJ SOLN
5000.0000 [IU] | Freq: Three times a day (TID) | INTRAMUSCULAR | Status: DC
  Administered 2016-11-19 – 2016-11-20 (×4): 5000 [IU] via SUBCUTANEOUS
  Filled 2016-11-18 (×4): qty 1

## 2016-11-18 MED ORDER — LACTATED RINGERS IV SOLN
INTRAVENOUS | Status: DC | PRN
  Administered 2016-11-18 (×2): via INTRAVENOUS

## 2016-11-18 MED ORDER — HYDROCODONE-ACETAMINOPHEN 5-325 MG PO TABS: 1.0000 | ORAL_TABLET | Freq: Four times a day (QID) | ORAL | Status: DC | PRN

## 2016-11-18 MED ORDER — METOCLOPRAMIDE HCL 5 MG PO TABS: 5.0000 mg | ORAL_TABLET | Freq: Three times a day (TID) | ORAL | Status: DC | PRN

## 2016-11-18 MED ORDER — METOCLOPRAMIDE HCL 5 MG/ML IJ SOLN: 5.0000 mg | Freq: Three times a day (TID) | INTRAMUSCULAR | Status: DC | PRN

## 2016-11-18 MED ORDER — PROPOFOL 10 MG/ML IV BOLUS
INTRAVENOUS | Status: DC | PRN
  Administered 2016-11-18: 40 mg via INTRAVENOUS
  Administered 2016-11-18 (×2): 20 mg via INTRAVENOUS

## 2016-11-18 MED ORDER — HYDROCODONE-ACETAMINOPHEN 5-325 MG PO TABS
1.0000 | ORAL_TABLET | Freq: Four times a day (QID) | ORAL | Status: DC | PRN
  Administered 2016-11-19 – 2016-11-20 (×3): 1 via ORAL
  Filled 2016-11-18 (×5): qty 1

## 2016-11-18 MED ORDER — SODIUM CHLORIDE 0.9 % IV SOLN
INTRAVENOUS | Status: DC
  Administered 2016-11-18: 13:00:00 via INTRAVENOUS

## 2016-11-18 MED ORDER — BISACODYL 10 MG RE SUPP: 10.0000 mg | Freq: Every day | RECTAL | Status: DC | PRN

## 2016-11-18 MED ORDER — POLYETHYLENE GLYCOL 3350 17 G PO PACK
17.0000 g | PACK | Freq: Every day | ORAL | Status: DC | PRN
  Administered 2016-11-19 – 2016-11-20 (×2): 17 g via ORAL
  Filled 2016-11-18 (×2): qty 1

## 2016-11-18 MED ORDER — MORPHINE SULFATE (PF) 2 MG/ML IV SOLN: 0.5000 mg | INTRAVENOUS | Status: DC | PRN

## 2016-11-18 MED ORDER — CEFAZOLIN SODIUM-DEXTROSE 2-3 GM-% IV SOLR
INTRAVENOUS | Status: DC | PRN
  Administered 2016-11-18: 2 g via INTRAVENOUS

## 2016-11-18 MED ORDER — LIDOCAINE 2% (20 MG/ML) 5 ML SYRINGE
INTRAMUSCULAR | Status: AC
  Filled 2016-11-18: qty 5

## 2016-11-18 MED ORDER — PROPOFOL 10 MG/ML IV BOLUS
INTRAVENOUS | Status: AC
  Filled 2016-11-18: qty 20

## 2016-11-18 MED ORDER — SUGAMMADEX SODIUM 200 MG/2ML IV SOLN
INTRAVENOUS | Status: AC
  Filled 2016-11-18: qty 2

## 2016-11-18 SURGICAL SUPPLY — 41 items
APL SKNCLS STERI-STRIP NONHPOA (GAUZE/BANDAGES/DRESSINGS)
BAG SPEC THK2 15X12 ZIP CLS (MISCELLANEOUS) ×1
BAG ZIPLOCK 12X15 (MISCELLANEOUS) ×2 IMPLANT
BENZOIN TINCTURE PRP APPL 2/3 (GAUZE/BANDAGES/DRESSINGS) IMPLANT
BLADE SAW SGTL 18X1.27X75 (BLADE) ×2 IMPLANT
BLADE SAW SGTL 18X1.27X75MM (BLADE) ×1
CAPT HIP HEMI 2 ×2 IMPLANT
CELLS DAT CNTRL 66122 CELL SVR (MISCELLANEOUS) ×1 IMPLANT
CLOSURE WOUND 1/2 X4 (GAUZE/BANDAGES/DRESSINGS)
CLOTH BEACON ORANGE TIMEOUT ST (SAFETY) ×3 IMPLANT
COVER PERINEAL POST (MISCELLANEOUS) ×3 IMPLANT
COVER SURGICAL LIGHT HANDLE (MISCELLANEOUS) ×3 IMPLANT
DRAPE STERI IOBAN 125X83 (DRAPES) ×3 IMPLANT
DRAPE U-SHAPE 47X51 STRL (DRAPES) ×6 IMPLANT
DRESSING AQUACEL AG SP 3.5X10 (GAUZE/BANDAGES/DRESSINGS) IMPLANT
DRSG AQUACEL AG ADV 3.5X10 (GAUZE/BANDAGES/DRESSINGS) ×3 IMPLANT
DRSG AQUACEL AG SP 3.5X10 (GAUZE/BANDAGES/DRESSINGS) ×3
DURAPREP 26ML APPLICATOR (WOUND CARE) ×3 IMPLANT
ELECT REM PT RETURN 15FT ADLT (MISCELLANEOUS) ×3 IMPLANT
GAUZE XEROFORM 1X8 LF (GAUZE/BANDAGES/DRESSINGS) ×2 IMPLANT
GLOVE BIO SURGEON STRL SZ7.5 (GLOVE) ×3 IMPLANT
GLOVE BIOGEL PI IND STRL 8 (GLOVE) ×2 IMPLANT
GLOVE BIOGEL PI INDICATOR 8 (GLOVE) ×4
GLOVE ECLIPSE 8.0 STRL XLNG CF (GLOVE) ×3 IMPLANT
GOWN STRL REUS W/TWL XL LVL3 (GOWN DISPOSABLE) ×6 IMPLANT
HANDPIECE INTERPULSE COAX TIP (DISPOSABLE) ×3
HOLDER FOLEY CATH W/STRAP (MISCELLANEOUS) ×3 IMPLANT
PACK ANTERIOR HIP CUSTOM (KITS) ×3 IMPLANT
RETRACTOR WND ALEXIS 18 MED (MISCELLANEOUS) ×1 IMPLANT
RTRCTR WOUND ALEXIS 18CM MED (MISCELLANEOUS) ×3
SET HNDPC FAN SPRY TIP SCT (DISPOSABLE) ×1 IMPLANT
STAPLER VISISTAT 35W (STAPLE) ×2 IMPLANT
STRIP CLOSURE SKIN 1/2X4 (GAUZE/BANDAGES/DRESSINGS) IMPLANT
SUT ETHIBOND NAB CT1 #1 30IN (SUTURE) ×3 IMPLANT
SUT MNCRL AB 4-0 PS2 18 (SUTURE) IMPLANT
SUT VIC AB 0 CT1 36 (SUTURE) ×3 IMPLANT
SUT VIC AB 1 CT1 36 (SUTURE) ×3 IMPLANT
SUT VIC AB 2-0 CT1 27 (SUTURE) ×6
SUT VIC AB 2-0 CT1 TAPERPNT 27 (SUTURE) ×2 IMPLANT
TRAY FOLEY W/METER SILVER 16FR (SET/KITS/TRAYS/PACK) ×3 IMPLANT
YANKAUER SUCT BULB TIP 10FT TU (MISCELLANEOUS) ×3 IMPLANT

## 2016-11-18 NOTE — Anesthesia Postprocedure Evaluation (Signed)
Anesthesia Post Note  Patient: Frank Horn  Procedure(s) Performed: Procedure(s) (LRB): BIPOLAR HIP ARTHROPLASTY ANTERIOR APPROACH (Right)  Patient location during evaluation: PACU Anesthesia Type: General Level of consciousness: awake and alert and confused (less confused than was pre-op) Pain management: pain level controlled Vital Signs Assessment: post-procedure vital signs reviewed and stable Respiratory status: spontaneous breathing, nonlabored ventilation, respiratory function stable and patient connected to nasal cannula oxygen Cardiovascular status: blood pressure returned to baseline and stable Postop Assessment: no signs of nausea or vomiting Anesthetic complications: no       Last Vitals:  Vitals:   11/18/16 1730 11/18/16 1739  BP: (!) 150/114 (!) 150/92  Pulse: (!) 54 85  Resp: 14 15  Temp:      Last Pain:  Vitals:   11/18/16 1700  PainSc: 4                  Blythe Veach,E. Jaelah Hauth

## 2016-11-18 NOTE — Brief Op Note (Signed)
11/18/2016  4:38 PM  PATIENT:  Frank Horn  83 y.o. male  PRE-OPERATIVE DIAGNOSIS:  right hip femoral neck fracture  POST-OPERATIVE DIAGNOSIS:  right hip femoral neck fracture  PROCEDURE:  Procedure(s): BIPOLAR HIP ARTHROPLASTY ANTERIOR APPROACH (Right)  SURGEON:  Surgeon(s) and Role:    * Mcarthur Rossetti, MD - Primary  PHYSICIAN ASSISTANT: Benita Stabile, PA-C  ANESTHESIA:   general  EBL:  Total I/O In: 1000 [I.V.:1000] Out: 200 [Blood:200]  COUNTS:  YES  DICTATION: .Other Dictation: Dictation Number [id number]  PLAN OF CARE: Admit to inpatient   PATIENT DISPOSITION:  PACU - hemodynamically stable.   Delay start of Pharmacological VTE agent (>24hrs) due to surgical blood loss or risk of bleeding: no

## 2016-11-18 NOTE — Progress Notes (Signed)
Dr. Annye Asa remained at bedside in PACU from arrival to departure of patient. Assisted in total management and was aware of all vital signs and mental state.

## 2016-11-18 NOTE — Addendum Note (Signed)
Addendum  created 11/18/16 1844 by Cynda Familia, CRNA   Anesthesia Intra LDAs edited, LDA properties accepted

## 2016-11-18 NOTE — ED Triage Notes (Signed)
Per EMS-slipped getting out of the shower-states possible right hip deformity-difficulty bearing weight-no blood thinners, at baseline mentally-lives at home with wife

## 2016-11-18 NOTE — Consult Note (Signed)
Reason for Consult:  Right hip fracture Referring Physician:  M. Tamera Punt, MD (EDP)  Frank Horn is an 83 y.o. male.  HPI:   83 yo male post an unwitnessed mechanical fall early this am around 4 am.  He does have mild to moderate dementia and lives at home with his wife.  He was transported to the [facility] ED with right hip pain and the inability to ambulate.  Was found to have a right hip fracture.  In the ED he is awake and alert with family at the bedside.  He does complain of right hip pain.  He has Afib, but is not on any anticoagulation.  Past Medical History:  Diagnosis Date  . Atrial fibrillation ([place])   . Hyperlipidemia   . Hypertension   . Polymyalgia ([place])    resolved  . Stroke [facility])     Past Surgical History:  Procedure Laterality Date  . HEMORRHOID SURGERY    . TOTAL KNEE ARTHROPLASTY Right 2011    Family History  Problem Relation Age of Onset  . Heart disease Mother   . Aneurysm Brother     Social History:  reports that he quit smoking about 7 years ago. His smoking use included Cigarettes. He has never used smokeless tobacco. He reports that he drinks alcohol. He reports that he does not use drugs.  Allergies:  Allergies  Allergen Reactions  . Lipitor [Atorvastatin]     myalgias    Medications: I have reviewed the patient's current medications.  Results for orders placed or performed during the hospital encounter of 11/18/16 (from the past 48 hour(s))  Basic metabolic panel     Status: Abnormal   Collection Time: 11/18/16 12:30 PM  Result Value Ref Range   Sodium 140 135 - 145 mmol/L   Potassium 3.4 (L) 3.5 - 5.1 mmol/L   Chloride 105 101 - 111 mmol/L   CO2 28 22 - 32 mmol/L   Glucose, Bld 133 (H) 65 - 99 mg/dL   BUN 13 6 - 20 mg/dL   Creatinine, Ser 0.89 0.61 - 1.24 mg/dL   Calcium 9.3 8.9 - 10.3 mg/dL   GFR calc non Af Amer >60 >60 mL/min   GFR calc Af Amer >60 >60 mL/min    Comment: (NOTE) The eGFR has been calculated using the CKD EPI  equation. This calculation has not been validated in all clinical situations. eGFR's persistently <60 mL/min signify possible Chronic Kidney Disease.    Anion gap 7 5 - 15  CBC WITH DIFFERENTIAL     Status: Abnormal   Collection Time: 11/18/16 12:30 PM  Result Value Ref Range   WBC 8.5 4.0 - 10.5 K/uL   RBC 4.25 4.22 - 5.81 MIL/uL   Hemoglobin 12.6 (L) 13.0 - 17.0 g/dL   HCT 37.2 (L) 39.0 - 52.0 %   MCV 87.5 78.0 - 100.0 fL   MCH 29.6 26.0 - 34.0 pg   MCHC 33.9 30.0 - 36.0 g/dL   RDW 13.8 11.5 - 15.5 %   Platelets 168 150 - 400 K/uL   Neutrophils Relative % 86 %   Neutro Abs 7.4 1.7 - 7.7 K/uL   Lymphocytes Relative 7 %   Lymphs Abs 0.6 (L) 0.7 - 4.0 K/uL   Monocytes Relative 7 %   Monocytes Absolute 0.6 0.1 - 1.0 K/uL   Eosinophils Relative 0 %   Eosinophils Absolute 0.0 0.0 - 0.7 K/uL   Basophils Relative 0 %   Basophils Absolute  0.0 0.0 - 0.1 K/uL  Protime-INR     Status: None   Collection Time: 11/18/16 12:30 PM  Result Value Ref Range   Prothrombin Time 15.1 11.4 - 15.2 seconds   INR 1.19   Type and screen [facility]     Status: None   Collection Time: 11/18/16 12:49 PM  Result Value Ref Range   ABO/RH(D) O POS    Antibody Screen NEG    Sample Expiration 11/21/2016     Dg Ankle Complete Right  Result Date: 11/18/2016 CLINICAL DATA:  83 year old male with history of trauma from a fall today complaining of right ankle pain. EXAM: RIGHT ANKLE - COMPLETE 3+ VIEW COMPARISON:  No priors. FINDINGS: Multiple views of the right ankle demonstrate no acute displaced fracture, subluxation, dislocation, or soft tissue abnormality. IMPRESSION: No acute radiographic abnormality of the right ankle. Electronically Signed   By: Vinnie Langton M.D.   On: 11/18/2016 12:05   Dg Chest Port 1 View  Result Date: 11/18/2016 CLINICAL DATA:  Preoperative examination prior to hip surgery. EXAM: PORTABLE CHEST 1 VIEW COMPARISON:  03/24/2010 FINDINGS: Cardiomegaly  identified. There is no evidence of focal airspace disease, pulmonary edema, suspicious pulmonary nodule/mass, pleural effusion, or pneumothorax. No acute bony abnormalities are identified. IMPRESSION: Cardiomegaly without evidence of acute cardiopulmonary disease. Electronically Signed   By: Margarette Canada M.D.   On: 11/18/2016 13:10   Dg Hip Unilat  With Pelvis 2-3 Views Right  Result Date: 11/18/2016 CLINICAL DATA:  83 year old male with history of trauma from a fall at home today complaining of pain in the right hip. EXAM: DG HIP (WITH OR WITHOUT PELVIS) 2-3V RIGHT COMPARISON:  No priors. FINDINGS: Multiple views of the pelvis and right hip demonstrate a transcervical right femoral neck fracture which appears foreshortened and angulated approximately 45 degrees. Right femoral head remains located. Bony pelvis appears intact. Left proximal femur appears intact as visualized. IMPRESSION: 1. Acute right-sided angulated transcervical femoral neck fracture, as above. Electronically Signed   By: Vinnie Langton M.D.   On: 11/18/2016 12:04    ROS Blood pressure (!) 171/103, pulse 87, resp. rate 16, SpO2 96 %. Physical Exam  Constitutional: He appears well-developed and well-nourished.  HENT:  Head: Normocephalic and atraumatic.  Eyes: Pupils are equal, round, and reactive to light.  Neck: Normal range of motion. Neck supple.  Cardiovascular: Normal rate.   Respiratory: Effort normal.  GI: Soft.  Musculoskeletal:       Right hip: He exhibits decreased range of motion, decreased strength, tenderness and bony tenderness.  Neurological: He is alert.  Skin: Skin is warm and dry.   His right leg is shortened and externally rotated.  His right ankle is swollen  X-rays reviewed by me independently show a right hip displaced femoral neck fracture   Assessment/Plan: Right hip displaced femoral neck fracture  I spoke with the family in length about our recommendation for a right hip hemiarthroplasty.   A thorough discussion of the risks and benefits of surgery was had.  He was seen by the Medicine Service and has been cleared for surgery.  We are obtained a head CT scan prior to surgery given his dementia due to the fall being unwitnessed.  If that is fine, will proceed to surgery today.  Mcarthur Rossetti 11/18/2016, 2:16 PM

## 2016-11-18 NOTE — ED Provider Notes (Signed)
[place] DEPT Provider Note   CSN: [id number] Arrival date & time: 11/18/16  1026     History   Chief Complaint Chief Complaint  Patient presents with  . Fall    HPI Frank Horn is a 83 y.o. male.  Patient is a 83 year old male with a history of A. fib, not on anticoagulants who presents after a fall. He has a history of dementia as well and lives at home with his wife and daughter. Per his wife he was walking to the bathroom and tripped on something on the floor. The patient states this as well. He did not hit his head. There is no evidence of head trauma. He complains of pain to his right hip. He denies any other injuries. The family did note some swelling to his right ankle this morning as well. He's had no other recent reported illnesses. History is limited due to his dementia.      Past Medical History:  Diagnosis Date  . Atrial fibrillation ([place])   . Hyperlipidemia   . Hypertension   . Polymyalgia ([place])    resolved  . Stroke [facility])     Patient Active Problem List   Diagnosis Date Noted  . Atherosclerosis of lower extremity with intermittent claudication ([place]) 06/26/2014  . 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 HYPERTENSION 11/30/2007  . STROKE 11/30/2007  . TRANSIENT ISCHEMIC ATTACK 11/30/2007  . DIVERTICULOSIS, COLON 11/30/2007  . OSTEOARTHRITIS 11/30/2007  . PEPTIC ULCER DISEASE, HX OF 11/30/2007  . NEPHROLITHIASIS, HX OF 11/30/2007  . TONSILLECTOMY, HX OF 11/30/2007  . HEMORRHOIDECTOMY, HX OF 11/30/2007    Past Surgical History:  Procedure Laterality Date  . HEMORRHOID SURGERY    . TOTAL KNEE ARTHROPLASTY Right 2011       Home Medications    Prior to Admission medications   Medication Sig Start Date End Date Taking? Authorizing Provider  aspirin 81 MG tablet Take 81 mg by mouth daily.    Historical Provider, MD  Cyanocobalamin (VITAMIN B 12 PO) Take by mouth.    Historical Provider, MD  Docusate Sodium (COLACE PO) Take 100 mg by  mouth as needed.    Historical Provider, MD  IBUPROFEN PO Take 1 tablet by mouth as needed.    Historical Provider, MD  Multiple Vitamin (MULTIVITAMIN) tablet Take 1 tablet by mouth daily.    Historical Provider, MD  pravastatin (PRAVACHOL) 80 MG tablet Take 80 mg by mouth daily.    Historical Provider, MD  ramipril (ALTACE) 2.5 MG capsule Take 2.5 mg by mouth daily.    Historical Provider, MD    Family History Family History  Problem Relation Age of Onset  . Heart disease Mother   . Aneurysm Brother     Social History Social History  Substance Use Topics  . Smoking status: Former Smoker    Types: Cigarettes    Quit date: 06/26/2009  . Smokeless tobacco: Never Used  . Alcohol use 0.0 oz/week     Allergies   Lipitor [atorvastatin]   Review of Systems Review of Systems  Constitutional: Negative for chills, diaphoresis, fatigue and fever.  HENT: Negative for congestion, rhinorrhea and sneezing.   Eyes: Negative.   Respiratory: Negative for cough, chest tightness and shortness of breath.   Cardiovascular: Negative for chest pain and leg swelling.  Gastrointestinal: Negative for abdominal pain, blood in stool, diarrhea, nausea and vomiting.  Genitourinary: Negative for difficulty urinating, flank pain, frequency and hematuria.  Musculoskeletal: Positive for arthralgias. Negative for back pain.  Skin: Negative for rash.  Neurological: Negative for dizziness, speech difficulty, weakness, numbness and headaches.     Physical Exam Updated Vital Signs BP (!) 171/103   Pulse 87   Resp 16   SpO2 96%   Physical Exam  Constitutional: He is oriented to person, place, and time. He appears well-developed and well-nourished.  HENT:  Head: Normocephalic and atraumatic.  Nose: Nose normal.  No hemotympanum  Eyes: Conjunctivae are normal. Pupils are equal, round, and reactive to light.  Neck:  No pain to the cervical, thoracic, or LS spine.  No step-offs or deformities noted    Cardiovascular: Normal rate and regular rhythm.   No murmur heard. No evidence of external trauma to the chest or abdomen  Pulmonary/Chest: Effort normal and breath sounds normal. No respiratory distress. He has no wheezes. He exhibits no tenderness.  Abdominal: Soft. Bowel sounds are normal. He exhibits no distension. There is no tenderness.  Musculoskeletal: Normal range of motion.  Patient has external rotation of the right leg with pain on movement of the right hip. There is no pain to the knee. There some mild tenderness to the right ankle with some mild swelling and erythema. Pulses are obtained by Doppler. There is no other pain on palpation or range of motion extremities  Neurological: He is alert and oriented to person, place, and time.  Skin: Skin is warm and dry. Capillary refill takes less than 2 seconds.  Psychiatric: He has a normal mood and affect.  Vitals reviewed.    ED Treatments / Results  Labs (all labs ordered are listed, but only abnormal results are displayed) Labs Reviewed  BASIC METABOLIC PANEL - Abnormal; Notable for the following:       Result Value   Potassium 3.4 (*)    Glucose, Bld 133 (*)    All other components within normal limits  CBC WITH DIFFERENTIAL/PLATELET - Abnormal; Notable for the following:    Hemoglobin 12.6 (*)    HCT 37.2 (*)    Lymphs Abs 0.6 (*)    All other components within normal limits  PROTIME-INR  TYPE AND SCREEN    EKG  EKG Interpretation  Date/Time:  Saturday November 18 2016 13:46:12 EDT Ventricular Rate:  95 PR Interval:    QRS Duration: 149 QT Interval:  436 QTC Calculation: 549 R Axis:   -75 Text Interpretation:  Sinus tachycardia Atrial premature complexes RBBB and LAFB Confirmed by Naif Alabi  MD, Christian Treadway ([id number]) on 11/18/2016 1:52:28 PM       Radiology Dg Ankle Complete Right  Result Date: 11/18/2016 CLINICAL DATA:  83 year old male with history of trauma from a fall today complaining of right ankle pain. EXAM:  RIGHT ANKLE - COMPLETE 3+ VIEW COMPARISON:  No priors. FINDINGS: Multiple views of the right ankle demonstrate no acute displaced fracture, subluxation, dislocation, or soft tissue abnormality. IMPRESSION: No acute radiographic abnormality of the right ankle. Electronically Signed   By: Vinnie Langton M.D.   On: 11/18/2016 12:05   Dg Chest Port 1 View  Result Date: 11/18/2016 CLINICAL DATA:  Preoperative examination prior to hip surgery. EXAM: PORTABLE CHEST 1 VIEW COMPARISON:  03/24/2010 FINDINGS: Cardiomegaly identified. There is no evidence of focal airspace disease, pulmonary edema, suspicious pulmonary nodule/mass, pleural effusion, or pneumothorax. No acute bony abnormalities are identified. IMPRESSION: Cardiomegaly without evidence of acute cardiopulmonary disease. Electronically Signed   By: Margarette Canada M.D.   On: 11/18/2016 13:10   Dg Hip Unilat  With Pelvis 2-3 Views Right  Result Date: 11/18/2016 CLINICAL DATA:  83 year old male with history of trauma from a fall at home today complaining of pain in the right hip. EXAM: DG HIP (WITH OR WITHOUT PELVIS) 2-3V RIGHT COMPARISON:  No priors. FINDINGS: Multiple views of the pelvis and right hip demonstrate a transcervical right femoral neck fracture which appears foreshortened and angulated approximately 45 degrees. Right femoral head remains located. Bony pelvis appears intact. Left proximal femur appears intact as visualized. IMPRESSION: 1. Acute right-sided angulated transcervical femoral neck fracture, as above. Electronically Signed   By: Vinnie Langton M.D.   On: 11/18/2016 12:04    Procedures Procedures (including critical care time)  Medications Ordered in ED Medications  fentaNYL (SUBLIMAZE) injection 50 mcg (50 mcg Intravenous Given 11/18/16 1305)  0.9 %  sodium chloride infusion ( Intravenous New Bag/Given 11/18/16 1239)     Initial Impression / Assessment and Plan / ED Course  I have reviewed the triage vital signs and the  nursing notes.  Pertinent labs & imaging results that were available during my care of the patient were reviewed by me and considered in my medical decision making (see chart for details).  Clinical Course as of Nov 18 1349  Sat Nov 18, 2016  1257 Spoke with Dr. Ninfa Linden who will see pt.  Requests hospitalist to admit and clear for surgery  [MB]    Clinical Course User Index [MB] Malvin Johns, MD    Patient presents with right hip pain after a fall. No other injuries are identified. He does have a right femoral neck fracture. I spoke with Dr. Ninfa Linden who will see the patient.  I spoke with Dr. Alfredia Ferguson who will admit the pt.  Final Clinical Impressions(s) / ED Diagnoses   Final diagnoses:  Closed right hip fracture, initial encounter [facility])    New Prescriptions New Prescriptions   No medications on file     Malvin Johns, MD 11/18/16 1352

## 2016-11-18 NOTE — Op Note (Signed)
NAMEBERNABE, DORCE               ACCOUNT NO.:  [id number]  MEDICAL RECORD NO.:  [id number]  LOCATION:  WA11                         FACILITY:  [facility]  PHYSICIAN:  Lind Guest. Ninfa Linden, M.D.DATE OF BIRTH:  [birth year]  DATE OF PROCEDURE:  11/18/2016 DATE OF DISCHARGE:                              OPERATIVE REPORT   PREOPERATIVE DIAGNOSIS:  Right hip displaced femoral neck fracture.  POSTOPERATIVE DIAGNOSIS:  Right hip displaced femoral neck fracture.  PROCEDURE:  Right hip hemiarthroplasty through direct anterior approach.  IMPLANTS:  DePuy size 12 Corail femoral component with standard offset, size 51-28 +1.5 bipolar metal hip ball.  SURGEON:  Lind Guest. Ninfa Linden, MD.  ASSISTANT:  Erskine Emery, PA-C.  ANESTHESIA:  General.  ANTIBIOTICS:  2 g of IV Ancef.  BLOOD LOSS:  Less than 200 mL.  COMPLICATIONS:  None.  INDICATIONS:  Mr. Campillo is a very pleasant 83 year old gentleman with some dementia, who lives at home with his wife.  Around 4 this morning, he sustained a mechanical fall that was unwitnessed in his house and called out to his wife.  He was eventually brought to the emergency room and found to have a displaced right hip femoral neck fracture.  He was seen by the medical service for evaluation and we deemed him clear for surgery.  We are recommending surgery due to the impact it will have on his quality of life.  Certainly, there is a risk of acute blood loss, anemia, nerve and vessel injury, fracture, infection, dislocation, and DVT.  Our goals are decreased pain, improved mobility, and overall improved quality of life.  PROCEDURE DESCRIPTION:  After informed consent was obtained, appropriate right hip was marked.  He was brought to the operating room and general anesthesia was obtained while he was on a stretcher.  Traction boots were placed on both his feet.  Next, he was placed supine on the Hana fracture table with the perineal post in place and  both legs in inline skeletal traction devices, but no traction applied.  His right operative hip was prepped and draped with DuraPrep and sterile drapes.  Time-out was called and he was identified as correct patient and correct right hip.  We then made an incision just inferior and posterior to the anterior superior iliac spine and carried this obliquely down the leg. We dissected down the tensor fascia lata muscle.  The tensor fascia was then divided longitudinally to proceed with a direct anterior approach to the hip.  We identified and cauterized the circumflex vessels and identified the hip capsule.  We opened up the hip capsule finding a large hematoma consistent with a femoral neck fracture.  We did find the femoral neck to be fracture completely with the femoral head displaced. We placed Cobra retractors around the medial and lateral femoral neck and made our risks freshening cut just proximal to the lesser trochanter and completed this on osteotome.  We placed a corkscrew guide in the femoral head, removed the femoral head its entirety, and then choose our sizing for a size 51 metal femoral head.  We then cleaned the acetabular with other debris and attention was turned to the femur with the leg externally  rotated to 120 degrees, extended, and adducted.  We were to place a Mueller retractor medially and a Hohmann retractor behind the greater trochanter.  We released the lateral joint capsule and used a box cutting osteotome to the inner femoral canal and a rongeur to lateralize.  We then began broaching from a size 8 broach using the Corail broaching system up to a size 12.  With a size 12 in place, we placed a standard neck and then a 51-28 +1.5 bipolar trial hip ball.  We brought the leg back over and up with traction and internal rotation reducing the pelvis.  We were pleased with the range of motion stability, offset, and leg length.  We then dislocated the hip and removed the  trial components.  We were able to place the real Corail femoral component size 12 with standard offset and the real bipolar 51- 28 +1.5 metal bipolar hip ball.  We then reduced this again in the acetabular and pleased with stability.  We then irrigated the soft tissue with normal saline solution using pulsatile lavage.  We closed the joint capsule with interrupted #1 Ethibond suture followed by running #1 Vicryl in the tensor fascia, 0 Vicryl in the deep tissue, 2-0 Vicryl in the subcutaneous tissue, interrupted staples on the skin. Xeroform and Aquacel dressing were applied.  He was then taken off the Hana table, awakened, extubated, and taken to recovery room in stable condition.  All final counts were correct.  There were no complications noted.  Of note, Erskine Emery, PA-C assisted the entire case from incision to closure and his assistance was crucial for facilitating all aspects of this case.     Lind Guest. Ninfa Linden, M.D.     CYB/MEDQ  D:  11/18/2016  T:  11/18/2016  Job:  [id number]

## 2016-11-18 NOTE — Anesthesia Preprocedure Evaluation (Addendum)
Anesthesia Evaluation  Patient identified by MRN, date of birth, ID band Patient awake and Patient confused    Reviewed: Allergy & Precautions, NPO status , Patient's Chart, lab work & pertinent test results  Airway Mallampati: I  TM Distance: >3 FB Neck ROM: Full    Dental  (+) Dental Advisory Given, Edentulous Upper, Edentulous Lower   Pulmonary former smoker,    breath sounds clear to auscultation       Cardiovascular hypertension (no meds presently), + dysrhythmias Atrial Fibrillation  Rhythm:Regular Rate:Normal  '11 ECHO: EF 60-65%, mild MR   Neuro/Psych dementiaTIACVA (confusion/dementia)    GI/Hepatic negative GI ROS, Neg liver ROS,   Endo/Other  negative endocrine ROS  Renal/GU negative Renal ROS     Musculoskeletal  (+) Arthritis , Osteoarthritis,    Abdominal   Peds  Hematology negative hematology ROS (+)   Anesthesia Other Findings   Reproductive/Obstetrics                           Anesthesia Physical Anesthesia Plan  ASA: III  Anesthesia Plan: General   Post-op Pain Management:    Induction: Intravenous  Airway Management Planned: Oral ETT  Additional Equipment:   Intra-op Plan:   Post-operative Plan: Extubation in OR  Informed Consent: I have reviewed the patients History and Physical, chart, labs and discussed the procedure including the risks, benefits and alternatives for the proposed anesthesia with the patient or authorized representative who has indicated his/her understanding and acceptance.   Consent reviewed with POA  Plan Discussed with: CRNA and Surgeon  Anesthesia Plan Comments: (Plan routine monitors, GETA)        Anesthesia Quick Evaluation

## 2016-11-18 NOTE — H&P (Addendum)
History and Physical    BHAVESH VAZQUEZ YJE:[id number] DOB: 12-May-1927 DOA: 11/18/2016  PCP: Melinda Crutch, MD   Patient coming from: Home  Chief Complaint: Fall and Right Hip Pain  HPI: Frank Horn is a 83 y.o. male with medical history significant of Atrial Fibrillation not on any anticoagulation because of prior hemorrhagic stroke, Hx of Ischemic Stroke,  HTN, HLD, Polymyalgia and other comorbids who was brought to [facility] after a fall. Patient is severely demented and history is limited so most of the history was obtained by family. Per family patient had an unwitnessed fall last night around 4:00 am and was on the floor. Patient's daughter helped him up and he couldn't really walk or bear weight so was brought to [facility] for evaluation. Family stated that the patient never endorsed any dizziness, lightheadedness, nausea, vomiting. He had no complaints except some pain. Was worked up and found to have a Right Hip Fracture. TR[facility] was asked to admit for Hip Fracture.  ED Course: Had blood work and a Hip and Ankle X-Ray as well as a Chest X-Ray.   Review of Systems: As per HPI otherwise 10 point review of systems negative.   Past Medical History:  Diagnosis Date  . Atrial fibrillation ([place])   . Hyperlipidemia   . Hypertension   . Polymyalgia ([place])    resolved  . Stroke [facility])    Past Surgical History:  Procedure Laterality Date  . HEMORRHOID SURGERY    . TOTAL KNEE ARTHROPLASTY Right 2011   SOCIAL HISTORY  reports that he quit smoking about 7 years ago. His smoking use included Cigarettes. He has never used smokeless tobacco. He reports that he drinks alcohol. He reports that he does not use drugs.  Allergies  Allergen Reactions  . Lipitor [Atorvastatin]     myalgias   Family History  Problem Relation Age of Onset  . Heart disease Mother   . Aneurysm Brother    Prior to Admission medications   Medication Sig Start Date End Date Taking? Authorizing Provider  aspirin 81 MG tablet  Take 81 mg by mouth daily.    Historical Provider, MD  Cyanocobalamin (VITAMIN B 12 PO) Take by mouth.    Historical Provider, MD  Docusate Sodium (COLACE PO) Take 100 mg by mouth as needed.    Historical Provider, MD  IBUPROFEN PO Take 1 tablet by mouth as needed.    Historical Provider, MD  Multiple Vitamin (MULTIVITAMIN) tablet Take 1 tablet by mouth daily.    Historical Provider, MD  pravastatin (PRAVACHOL) 80 MG tablet Take 80 mg by mouth daily.    Historical Provider, MD  ramipril (ALTACE) 2.5 MG capsule Take 2.5 mg by mouth daily.    Historical Provider, MD   Physical Exam: Vitals:   11/18/16 1100 11/18/16 1200 11/18/16 1300 11/18/16 1450  BP: (!) 180/120 (!) 171/125 (!) 171/103 (!) 163/97  Pulse: (!) 47 82 87 78  Resp: (!) 21 14 16 19   SpO2: 98% 95% 96% 93%   Constitutional: Pleasantly demented Caucasian male who is in NAD and appears calm and in some pain Eyes: Lids and conjunctivae normal, sclerae anicteric  ENMT: External Ears, Nose appear normal. Slightly hard of hearing. Neck: Appears normal, supple, no cervical masses, normal ROM, no appreciable thyromegaly, no JVD Respiratory: Clear to auscultation bilaterally, no wheezing, rales, rhonchi or crackles. Normal respiratory effort and patient is not tachypenic. No accessory muscle use.  Cardiovascular: RRR, no murmurs / rubs / gallops. S1 and  S2 auscultated. Right ankle has some edema Abdomen: Soft, non-tender, non-distended. No masses palpated. No appreciable hepatosplenomegaly. Bowel sounds positive x4.  GU: Deferred. Musculoskeletal: No clubbing / cyanosis of digits/nails. Right hip is shortened and externally rotated and there is swelling of the right ankle compared to the left Skin: No rashes, lesions, ulcers on limited skin exam but patient does have some bruising on upper extremities and lower extremity. No induration; Warm and dry.  Neurologic: CN 2-12 grossly intact with no focal deficits. Romberg sign cerebellar  reflexes not assessed.  Psychiatric: Impaired judgment and insight. Alert but not oriented x 3. Normal mood and appropriate affect.   Labs on Admission: I have personally reviewed following labs and imaging studies  CBC:  Recent Labs Lab 11/18/16 1230  WBC 8.5  NEUTROABS 7.4  HGB 12.6*  HCT 37.2*  MCV 87.5  PLT 161   Basic Metabolic Panel:  Recent Labs Lab 11/18/16 1230  NA 140  K 3.4*  CL 105  CO2 28  GLUCOSE 133*  BUN 13  CREATININE 0.89  CALCIUM 9.3   GFR: CrCl cannot be calculated (Unknown ideal weight.). Liver Function Tests: No results for input(s): AST, ALT, ALKPHOS, BILITOT, PROT, ALBUMIN in the last 168 hours. No results for input(s): LIPASE, AMYLASE in the last 168 hours. No results for input(s): AMMONIA in the last 168 hours. Coagulation Profile:  Recent Labs Lab 11/18/16 1230  INR 1.19   Cardiac Enzymes: No results for input(s): CKTOTAL, CKMB, CKMBINDEX, TROPONINI in the last 168 hours. BNP (last 3 results) No results for input(s): PROBNP in the last 8760 hours. HbA1C: No results for input(s): HGBA1C in the last 72 hours. CBG: No results for input(s): GLUCAP in the last 168 hours. Lipid Profile: No results for input(s): CHOL, HDL, LDLCALC, TRIG, CHOLHDL, LDLDIRECT in the last 72 hours. Thyroid Function Tests: No results for input(s): TSH, T4TOTAL, FREET4, T3FREE, THYROIDAB in the last 72 hours. Anemia Panel: No results for input(s): VITAMINB12, FOLATE, FERRITIN, TIBC, IRON, RETICCTPCT in the last 72 hours. Urine analysis:    Component Value Date/Time   COLORURINE YELLOW 03/24/2010 1953   APPEARANCEUR CLEAR 03/24/2010 1953   LABSPEC 1.010 03/24/2010 1953   PHURINE 7.5 03/24/2010 1953   GLUCOSEU NEGATIVE 03/24/2010 1953   HGBUR NEGATIVE 03/24/2010 1953   BILIRUBINUR NEGATIVE 03/24/2010 1953   KETONESUR NEGATIVE 03/24/2010 1953   PROTEINUR NEGATIVE 03/24/2010 1953   UROBILINOGEN 0.2 03/24/2010 1953   NITRITE NEGATIVE 03/24/2010 1953    LEUKOCYTESUR  03/24/2010 1953    NEGATIVE MICROSCOPIC NOT DONE ON URINES WITH NEGATIVE PROTEIN, BLOOD, LEUKOCYTES, NITRITE, OR GLUCOSE <1000 mg/dL.   Sepsis Labs: !!!!!!!!!!!!!!!!!!!!!!!!!!!!!!!!!!!!!!!!!!!! @LABRCNTIP (procalcitonin:4,lacticidven:4) )No results found for this or any previous visit (from the past 240 hour(s)).   Radiological Exams on Admission: Dg Ankle Complete Right  Result Date: 11/18/2016 CLINICAL DATA:  83 year old male with history of trauma from a fall today complaining of right ankle pain. EXAM: RIGHT ANKLE - COMPLETE 3+ VIEW COMPARISON:  No priors. FINDINGS: Multiple views of the right ankle demonstrate no acute displaced fracture, subluxation, dislocation, or soft tissue abnormality. IMPRESSION: No acute radiographic abnormality of the right ankle. Electronically Signed   By: Vinnie Langton M.D.   On: 11/18/2016 12:05   Ct Head Wo Contrast  Result Date: 11/18/2016 CLINICAL DATA:  Fall getting out of shower, right hip deformity EXAM: CT HEAD WITHOUT CONTRAST TECHNIQUE: Contiguous axial images were obtained from the base of the skull through the vertex without intravenous contrast. COMPARISON:  03/27/2010  FINDINGS: Brain: No intracranial hemorrhage, mass effect or midline shift. Moderate cerebral atrophy. There is periventricular an extensive patchy subcortical white matter decreased attenuation probable due to chronic small vessel ischemic changes. Small lacunar infarct in left basal ganglia. Stable encephalomalacia sequela from prior hemorrhage in left posterior parietal lobe and occipital lobe. No definite acute cortical infarction. No mass lesion is noted on this unenhanced scan. Vascular: Mild atherosclerotic calcifications of carotid siphon Skull: No skull fracture is noted. Sinuses/Orbits: No acute findings Other: None IMPRESSION: No acute intracranial abnormality. Stable cerebral atrophy. Moderate periventricular and patchy subcortical white matter decreased  attenuation probable due to chronic small vessel ischemic changes. There is encephalomalacia sequela from prior hemorrhage in left posterior parietal and left occipital lobe. No definite acute cortical infarction. Electronically Signed   By: Lahoma Crocker M.D.   On: 11/18/2016 14:55   Dg Chest Port 1 View  Result Date: 11/18/2016 CLINICAL DATA:  Preoperative examination prior to hip surgery. EXAM: PORTABLE CHEST 1 VIEW COMPARISON:  03/24/2010 FINDINGS: Cardiomegaly identified. There is no evidence of focal airspace disease, pulmonary edema, suspicious pulmonary nodule/mass, pleural effusion, or pneumothorax. No acute bony abnormalities are identified. IMPRESSION: Cardiomegaly without evidence of acute cardiopulmonary disease. Electronically Signed   By: Margarette Canada M.D.   On: 11/18/2016 13:10   Dg Hip Unilat  With Pelvis 2-3 Views Right  Result Date: 11/18/2016 CLINICAL DATA:  83 year old male with history of trauma from a fall at home today complaining of pain in the right hip. EXAM: DG HIP (WITH OR WITHOUT PELVIS) 2-3V RIGHT COMPARISON:  No priors. FINDINGS: Multiple views of the pelvis and right hip demonstrate a transcervical right femoral neck fracture which appears foreshortened and angulated approximately 45 degrees. Right femoral head remains located. Bony pelvis appears intact. Left proximal femur appears intact as visualized. IMPRESSION: 1. Acute right-sided angulated transcervical femoral neck fracture, as above. Electronically Signed   By: Vinnie Langton M.D.   On: 11/18/2016 12:04   EKG: Independently reviewed. Poor quality study but did not show any ST elevation on my interpretation.   Assessment/Plan Principal Problem:   Closed right hip fracture, initial encounter [facility]) Active Problems:   Essential hypertension   Hypokalemia   Unwitnessed fall   AF (paroxysmal atrial fibrillation) (HCC)   Hyperlipidemia   History of CVA (cerebrovascular accident)  Acute right-sided angulated  transcervical femoral neck fracture -Hip X-Ray showed Multiple views of the pelvis and right hip demonstrate a transcervical right femoral neck fracture which appears foreshortened and angulated approximately 45 degrees. Right femoral head remains located. Bony pelvis appears intact. Left proximal femur appears intact as visualized. -Hip Fracture order set utilized -Dr. Marciano Sequin in Orthopedics consulted and further management per Ortho; Patient to go to Surgery today -Pain control with Morphine IV -C/w IVF with NS at 75 mL/hr -Bed Rest -No further workup is necessary for Surgery -Will need   Unwitnessed Fall -Checked Head CT to r/o intracranial bleed (Has a hx of hemorrhagic CVA) showed no acute intracranial abnormality -Continue to Monitor with Neurochecks  Hypertension  -Likely 2/2 to Pain -C/w Pain Control; -Per Family no longer taking any BP meds and does not take Ramipril  -Will add Hydralazine when needed  Mild Hypokalemia -K+ was 3.4 -Replete with IV KCl 30 mEQ as patient is NPO -Repeat CMP in AM  Atrial Fibrillation -Appeared to be in Sinus Rhythm -Not on Anticoagulation given history of Hemorrhagic CVA -Takes ASA 81 mg and will hold  Hyperlipidemia -Cannot take Statins because  of Myalgias  Hx of CVA (Hemorrhagic and Ischemic) -No active issue -Head CT showed encephalomalacia sequela from prior hemorrhage in left posterior parietal and left occipital lobe  DVT prophylaxis: Heparin sq 5,000 units Code Status: DO NOT RESUSCITATE Family Communication: Discussed with wife and daughter at bedside Disposition Plan: Likely SNF Consults called: Orthopedics Dr. Marciano Sequin Admission status: Inpatient Telemetry  [place], D.O. [facility] Pager [phone]  If 7PM-7AM, please contact night-coverage [email or web address] Password [facility]  11/18/2016, 3:28 PM

## 2016-11-18 NOTE — ED Notes (Signed)
Patient transported to CT 

## 2016-11-18 NOTE — Anesthesia Procedure Notes (Signed)
Procedure Name: Intubation Date/Time: 11/18/2016 3:35 PM Performed by: Cynda Familia Pre-anesthesia Checklist: Patient identified, Emergency Drugs available, Suction available and Patient being monitored Patient Re-evaluated:Patient Re-evaluated prior to inductionOxygen Delivery Method: Circle System Utilized Preoxygenation: Pre-oxygenation with 100% oxygen Intubation Type: IV induction Ventilation: Mask ventilation without difficulty Laryngoscope Size: 2 and Miller Grade View: Grade I Tube type: Oral Number of attempts: 1 Airway Equipment and Method: Stylet Placement Confirmation: ETT inserted through vocal cords under direct vision,  positive ETCO2 and breath sounds checked- equal and bilateral Secured at: 22 cm Tube secured with: Tape Dental Injury: Teeth and Oropharynx as per pre-operative assessment  Comments: Smooth IV induction Glennon Mac--- intubation AM CRNA atraumatic-- teeth and mouth as preop ( no teeth)--  bilat BS Glennon Mac

## 2016-11-18 NOTE — Transfer of Care (Signed)
Immediate Anesthesia Transfer of Care Note  Patient: Frank Horn  Procedure(s) Performed: Procedure(s): BIPOLAR HIP ARTHROPLASTY ANTERIOR APPROACH (Right)  Patient Location: PACU  Anesthesia Type:General  Level of Consciousness: awake and alert   Airway & Oxygen Therapy: Patient Spontanous Breathing and Patient connected to face mask oxygen  Post-op Assessment: Report given to RN and Post -op Vital signs reviewed and stable  Post vital signs: Reviewed and stable  Last Vitals:  Vitals:   11/18/16 1300 11/18/16 1450  BP: (!) 171/103 (!) 163/97  Pulse: 87 78  Resp: 16 19    Last Pain:  Vitals:   11/18/16 1447  PainSc: 6          Complications: No apparent anesthesia complications

## 2016-11-19 DIAGNOSIS — Z419 Encounter for procedure for purposes other than remedying health state, unspecified: Secondary | ICD-10-CM

## 2016-11-19 DIAGNOSIS — Z96641 Presence of right artificial hip joint: Secondary | ICD-10-CM

## 2016-11-19 LAB — CBC
HEMATOCRIT: 29.1 % — AB (ref 39.0–52.0)
Hemoglobin: 9.9 g/dL — ABNORMAL LOW (ref 13.0–17.0)
MCH: 30.8 pg (ref 26.0–34.0)
MCHC: 34 g/dL (ref 30.0–36.0)
MCV: 90.7 fL (ref 78.0–100.0)
Platelets: 157 10*3/uL (ref 150–400)
RBC: 3.21 MIL/uL — ABNORMAL LOW (ref 4.22–5.81)
RDW: 14.1 % (ref 11.5–15.5)
WBC: 8.3 10*3/uL (ref 4.0–10.5)

## 2016-11-19 LAB — BASIC METABOLIC PANEL
ANION GAP: 8 (ref 5–15)
BUN: 11 mg/dL (ref 6–20)
CALCIUM: 8 mg/dL — AB (ref 8.9–10.3)
CO2: 28 mmol/L (ref 22–32)
Chloride: 105 mmol/L (ref 101–111)
Creatinine, Ser: 0.98 mg/dL (ref 0.61–1.24)
GLUCOSE: 138 mg/dL — AB (ref 65–99)
POTASSIUM: 4 mmol/L (ref 3.5–5.1)
Sodium: 141 mmol/L (ref 135–145)

## 2016-11-19 MED ORDER — METOPROLOL TARTRATE 5 MG/5ML IV SOLN: 2.5000 mg | Freq: Four times a day (QID) | INTRAVENOUS | Status: DC | PRN

## 2016-11-19 MED ORDER — HYDRALAZINE HCL 20 MG/ML IJ SOLN: 10.0000 mg | Freq: Four times a day (QID) | INTRAMUSCULAR | Status: DC | PRN

## 2016-11-19 MED ORDER — CHLORHEXIDINE GLUCONATE 4 % EX LIQD: 60.0000 mL | Freq: Once | CUTANEOUS | Status: DC

## 2016-11-19 MED ORDER — POVIDONE-IODINE 10 % EX SWAB: 2.0000 "application " | Freq: Once | CUTANEOUS | Status: DC

## 2016-11-19 MED ORDER — ASPIRIN 325 MG PO TBEC: 325.0000 mg | DELAYED_RELEASE_TABLET | Freq: Every day | ORAL | 0 refills | Status: AC

## 2016-11-19 MED ORDER — CEFAZOLIN SODIUM-DEXTROSE 2-4 GM/100ML-% IV SOLN: 2.0000 g | INTRAVENOUS | Status: DC

## 2016-11-19 NOTE — Progress Notes (Signed)
In/out cath performed. 500 mL of urine obtained, post residual bladder scan showed 79mL urine left. Pt. Is comfortable in the bed, resting. Will continue to monitor output

## 2016-11-19 NOTE — Progress Notes (Signed)
Subjective: 1 Day Post-Op Procedure(s) (LRB): BIPOLAR HIP ARTHROPLASTY ANTERIOR APPROACH (Right) Patient reports pain as moderate.  Awake with family at the bedside, but significantly confused and needing restraints.  Acute blood loss anemia from his fracture and surgery.  Objective: Vital signs in last 24 hours: Temp:  [97.4 F (36.3 C)-99.5 F (37.5 C)] 99.5 F (37.5 C) (04/15 0951) Pulse Rate:  [32-120] 120 (04/15 0951) Resp:  [11-23] 17 (04/15 0951) BP: (123-177)/(81-117) 150/98 (04/15 0951) SpO2:  [80 %-96 %] 95 % (04/15 0951) Weight:  [145 lb 8.1 oz (66 kg)] 145 lb 8.1 oz (66 kg) (04/14 1800)  Intake/Output from previous day: 04/14 0701 - 04/15 0700 In: 2508.8 [I.V.:2408.8; IV Piggyback:100] Out: 718 [Urine:518; Blood:200] Intake/Output this shift: Total I/O In: 540 [P.O.:240; I.V.:300] Out: -    Recent Labs  11/18/16 1230 11/19/16 0514  HGB 12.6* 9.9*    Recent Labs  11/18/16 1230 11/19/16 0514  WBC 8.5 8.3  RBC 4.25 3.21*  HCT 37.2* 29.1*  PLT 168 157    Recent Labs  11/18/16 1230 11/19/16 0514  NA 140 141  K 3.4* 4.0  CL 105 105  CO2 28 28  BUN 13 11  CREATININE 0.89 0.98  GLUCOSE 133* 138*  CALCIUM 9.3 8.0*    Recent Labs  11/18/16 1230  INR 1.19    Intact pulses distally Dorsiflexion/Plantar flexion intact Incision: scant drainage  Assessment/Plan: 1 Day Post-Op Procedure(s) (LRB): BIPOLAR HIP ARTHROPLASTY ANTERIOR APPROACH (Right) Up with therapy Discharge to SNF when medically stable.  Mcarthur Rossetti 11/19/2016, 12:24 PM

## 2016-11-19 NOTE — Progress Notes (Signed)
Pt. Voided only once at the beginning of shift. Pt. c/o discomfort. Bladder scan shows >550 mL. On call MD Hal Hope paged and made aware and asked for order for in/out cath. Verbal order obtained. Will perform in/out cath and do post residual bladder scan.

## 2016-11-19 NOTE — Progress Notes (Signed)
PROGRESS NOTE    Frank Horn  ZJQ:[id number] DOB: 09-Jun-1927 DOA: 11/18/2016 PCP: Melinda Crutch, MD   Brief Narrative:  Frank Horn is a 83 y.o. male with medical history significant of Atrial Fibrillation not on any anticoagulation because of prior hemorrhagic stroke, Hx of Ischemic Stroke,  HTN, HLD, Polymyalgia and other comorbids who was brought to [facility] after a fall. Patient is severely demented and history is limited so most of the history was obtained by family. Per family patient had an unwitnessed fall last night around 4:00 am and was on the floor. Patient's daughter helped him up and he couldn't really walk or bear weight so was brought to [facility] for evaluation. Family stated that the patient never endorsed any dizziness, lightheadedness, nausea, vomiting. He had no complaints except some pain. Was worked up and found to have a Right Hip Fracture. TRH was asked to admit for Hip Fracture. Patient underwent Anterior Hip Arthroplasty 4/14. PT evaluated and recommending SNF.  Assessment & Plan:   Principal Problem:   Closed right hip fracture, initial encounter [facility]) Active Problems:   Essential hypertension   Hypokalemia   Unwitnessed fall   AF (paroxysmal atrial fibrillation) (HCC)   Hyperlipidemia   History of CVA (cerebrovascular accident)   Acute right-sided angulated transcervical femoral neck fracture s/p Right Hip Hemiarthroplasty POD1 -Hip X-Ray showed Multiple views of the pelvis and right hip demonstrate a transcervical right femoral neck fracture which appears foreshortened and angulated approximately 45 degrees. Right femoral head remains located. Bony pelvis appears intact. Left proximal femur appears intact as visualized. -Hip Fracture order set utilized -Dr. Marciano Sequin in Orthopedics consulted and further management per Ortho; Patient to go to Surgery today -C/w Pain Control with Morphine 0.5 mg po q2hprn, Hydrocodone-Acetaminophen 1-2 Tab po q6hprn, and  Acetaminophen 650 mg po q6hprn; -Bowel Regimen with Docusate 100 mg po BID and Miralax Daily prn -Increased IVF with NS at 75 mL/hr -PT/OT to Evaluate and treat  ABLA -Likely in combination with Dilutional Drop -Patient's Hb/Hct went from 12.6/37.2 -> 9.9/29.1 -Continue to Monitor for S/Sx of Bleeding -Repeat CBC in AM  Unwitnessed Fall -Checked Head CT to r/o intracranial bleed (Has a hx of hemorrhagic CVA) showed no acute intracranial abnormality -Continue to Monitor with Neurochecks -Continue to Reorient because of Dementia  Hypertension  -Likely 2/2 to Pain -C/w Pain Control with Morphine 0.5 mg po q2hprn and with Hydrocodone-Acetaminophen 1-2 Tab po q6hprn; -Per Family no longer taking any BP meds and does not take Ramipril  -Will add Hydralazine when needed  Mild Hypokalemia -K+ was 3.4 and improved to 4.0 -Repleted with IV KCl 30 mEQ yesterday -Repeat CMP in AM  Paroxysmal Atrial Fibrillation -C/w Telemetry -Increase Fluids to 100 mL/hr -Now Tachycardic with bursts into the 140's -Not on Anticoagulation given history of Hemorrhagic CVA -Takes ASA 81 mg at home and D/C'd -On ASA 325 mg po Daily with Breakfast from Ortho for DVT Prophylaxis -If continues to have Tachycardia may need to increase Pain meds and add low dose BB  Hyperlipidemia -Cannot take Statins because of Myalgias  Hx of CVA (Hemorrhagic and Ischemic) -No active issue -Head CT showed encephalomalacia sequela from prior hemorrhage in left posterior parietal and left occipital lobe -Now on ASA 325 mg po Daily per Ortho  DVT prophylaxis: Heparin 5,000 sq Code Status: DO NOT RESUSCITATE Family Communication: No Family Present at Bedside Disposition Plan: SNF   Consultants:   Orthopedics Dr. Ninfa Linden   Procedures:  -s/p Right  Hip Hemiarthroplasty through direct anterior approach   Antimicrobials:  Anti-infectives    Start     Dose/Rate Route Frequency Ordered Stop   11/19/16 0700   ceFAZolin (ANCEF) IVPB 2g/100 mL premix  Status:  Discontinued     2 g 200 mL/hr over 30 Minutes Intravenous On call to O.R. 11/19/16 0645 11/19/16 0651   11/18/16 2200  ceFAZolin (ANCEF) IVPB 2g/100 mL premix     2 g 200 mL/hr over 30 Minutes Intravenous Every 6 hours 11/18/16 1813 11/19/16 0455   11/18/16 1514  ceFAZolin (ANCEF) 2-4 GM/100ML-% IVPB    Comments:  Octavio Graves   : cabinet override      11/18/16 1514 11/19/16 0329     Subjective: Seen and examined and no history was obtainable because his dementia. Denied any pain and asked me when I was going to "play the game." No nausea or vomiting. No other problems or concerns and sitter is at bedside.   Objective: Vitals:   11/18/16 2154 11/18/16 2234 11/19/16 0114 11/19/16 0435  BP: (!) 146/117 140/82 123/81 (!) 135/92  Pulse: 94  60 92  Resp:  18  18  Temp: 98 F (36.7 C)  98.7 F (37.1 C) 98.2 F (36.8 C)  TempSrc: Oral  Oral Oral  SpO2: 93%  93% 94%  Weight:      Height:        Intake/Output Summary (Last 24 hours) at 11/19/16 0750 Last data filed at 11/19/16 0600  Gross per 24 hour  Intake          2508.75 ml  Output              718 ml  Net          1790.75 ml   Filed Weights   11/18/16 1800  Weight: 66 kg (145 lb 8.1 oz)    Examination: Physical Exam:  Constitutional: Pleasantly demented Caucasian male who is slightly agitated and confused but does not appear to be in any distress Eyes: Lids and conjunctivae normal, sclerae anicteric  ENMT: External Ears, Nose appear normal. Slightly hard of hearing. Neck: Appears normal, supple, no cervical masses, normal ROM, no appreciable thyromegaly, no JVD Respiratory: Clear to auscultation bilaterally, no wheezing, rales, rhonchi or crackles. Normal respiratory effort and patient is not tachypenic. No accessory muscle use.  Cardiovascular: Irregularly Irregular Tachycardic Rate and Rhythm, no murmurs / rubs / gallops. S1 and S2 auscultated. Right ankle has some  edema Abdomen: Soft, non-tender, non-distended. No masses palpated. No appreciable hepatosplenomegaly. Bowel sounds positive x4.  GU: Deferred. Musculoskeletal: No clubbing / cyanosis of digits/nails.  Skin: No rashes, lesions, ulcers on limited skin exam but patient does have some bruising on upper extremities and lower extremity. No induration; Warm and dry. Right Hip incision appeared to be C/D/I and was covered Neurologic: CN 2-12 grossly intact with no focal deficits. Romberg sign cerebellar reflexes not assessed.  Psychiatric: Impaired judgment and insight. Alert but not oriented x 3. Normal mood and appropriate affect.   Data Reviewed: I have personally reviewed following labs and imaging studies  CBC:  Recent Labs Lab 11/18/16 1230 11/19/16 0514  WBC 8.5 8.3  NEUTROABS 7.4  --   HGB 12.6* 9.9*  HCT 37.2* 29.1*  MCV 87.5 90.7  PLT 168 629   Basic Metabolic Panel:  Recent Labs Lab 11/18/16 1230 11/19/16 0514  NA 140 141  K 3.4* 4.0  CL 105 105  CO2 28 28  GLUCOSE 133* 138*  BUN 13 11  CREATININE 0.89 0.98  CALCIUM 9.3 8.0*   GFR: Estimated Creatinine Clearance: 49.6 mL/min (by C-G formula based on SCr of 0.98 mg/dL). Liver Function Tests: No results for input(s): AST, ALT, ALKPHOS, BILITOT, PROT, ALBUMIN in the last 168 hours. No results for input(s): LIPASE, AMYLASE in the last 168 hours. No results for input(s): AMMONIA in the last 168 hours. Coagulation Profile:  Recent Labs Lab 11/18/16 1230  INR 1.19   Cardiac Enzymes: No results for input(s): CKTOTAL, CKMB, CKMBINDEX, TROPONINI in the last 168 hours. BNP (last 3 results) No results for input(s): PROBNP in the last 8760 hours. HbA1C: No results for input(s): HGBA1C in the last 72 hours. CBG: No results for input(s): GLUCAP in the last 168 hours. Lipid Profile: No results for input(s): CHOL, HDL, LDLCALC, TRIG, CHOLHDL, LDLDIRECT in the last 72 hours. Thyroid Function Tests: No results for  input(s): TSH, T4TOTAL, FREET4, T3FREE, THYROIDAB in the last 72 hours. Anemia Panel: No results for input(s): VITAMINB12, FOLATE, FERRITIN, TIBC, IRON, RETICCTPCT in the last 72 hours. Sepsis Labs: No results for input(s): PROCALCITON, LATICACIDVEN in the last 168 hours.  No results found for this or any previous visit (from the past 240 hour(s)).   Radiology Studies: Dg Ankle Complete Right  Result Date: 11/18/2016 CLINICAL DATA:  83 year old male with history of trauma from a fall today complaining of right ankle pain. EXAM: RIGHT ANKLE - COMPLETE 3+ VIEW COMPARISON:  No priors. FINDINGS: Multiple views of the right ankle demonstrate no acute displaced fracture, subluxation, dislocation, or soft tissue abnormality. IMPRESSION: No acute radiographic abnormality of the right ankle. Electronically Signed   By: Vinnie Langton M.D.   On: 11/18/2016 12:05   Ct Head Wo Contrast  Result Date: 11/18/2016 CLINICAL DATA:  Fall getting out of shower, right hip deformity EXAM: CT HEAD WITHOUT CONTRAST TECHNIQUE: Contiguous axial images were obtained from the base of the skull through the vertex without intravenous contrast. COMPARISON:  03/27/2010 FINDINGS: Brain: No intracranial hemorrhage, mass effect or midline shift. Moderate cerebral atrophy. There is periventricular an extensive patchy subcortical white matter decreased attenuation probable due to chronic small vessel ischemic changes. Small lacunar infarct in left basal ganglia. Stable encephalomalacia sequela from prior hemorrhage in left posterior parietal lobe and occipital lobe. No definite acute cortical infarction. No mass lesion is noted on this unenhanced scan. Vascular: Mild atherosclerotic calcifications of carotid siphon Skull: No skull fracture is noted. Sinuses/Orbits: No acute findings Other: None IMPRESSION: No acute intracranial abnormality. Stable cerebral atrophy. Moderate periventricular and patchy subcortical white matter decreased  attenuation probable due to chronic small vessel ischemic changes. There is encephalomalacia sequela from prior hemorrhage in left posterior parietal and left occipital lobe. No definite acute cortical infarction. Electronically Signed   By: Lahoma Crocker M.D.   On: 11/18/2016 14:55   Pelvis Portable  Result Date: 11/18/2016 CLINICAL DATA:  Right hip arthroplasty. EXAM: PORTABLE PELVIS 1-2 VIEWS COMPARISON:  None. FINDINGS: Single AP view pelvis demonstrates right hip arthroplasty, without acute hardware complication. Mild to moderate left hip osteoarthritis. Surgical staples superficial to the joint. IMPRESSION: Expected appearance after right hip arthroplasty. Electronically Signed   By: Abigail Miyamoto M.D.   On: 11/18/2016 18:50   Dg Chest Port 1 View  Result Date: 11/18/2016 CLINICAL DATA:  Preoperative examination prior to hip surgery. EXAM: PORTABLE CHEST 1 VIEW COMPARISON:  03/24/2010 FINDINGS: Cardiomegaly identified. There is no evidence of focal airspace disease, pulmonary edema, suspicious pulmonary nodule/mass, pleural effusion,  or pneumothorax. No acute bony abnormalities are identified. IMPRESSION: Cardiomegaly without evidence of acute cardiopulmonary disease. Electronically Signed   By: Margarette Canada M.D.   On: 11/18/2016 13:10   Dg C-arm 1-60 Min-no Report  Result Date: 11/18/2016 Fluoroscopy was utilized by the requesting physician.  No radiographic interpretation.   Dg Hip Operative Unilat W Or W/o Pelvis Right  Result Date: 11/18/2016 CLINICAL DATA:  Right hip replacement common known right hip fracture EXAM: OPERATIVE RIGHT HIP WITH PELVIS COMPARISON:  11/18/2016 FLUOROSCOPY TIME:  Radiation Exposure Index (as provided by the fluoroscopic device): 0.72 mGy If the device does not provide the exposure index: Fluoroscopy Time:  10.6 seconds Number of Acquired Images:  4 FINDINGS: Right hip fracture is again seen on the initial images. Right hip prosthesis is subsequent shown in  satisfactory position. No acute abnormality noted. IMPRESSION: Status post right hip replacement Electronically Signed   By: Inez Catalina M.D.   On: 11/18/2016 18:46   Dg Hip Unilat  With Pelvis 2-3 Views Right  Result Date: 11/18/2016 CLINICAL DATA:  83 year old male with history of trauma from a fall at home today complaining of pain in the right hip. EXAM: DG HIP (WITH OR WITHOUT PELVIS) 2-3V RIGHT COMPARISON:  No priors. FINDINGS: Multiple views of the pelvis and right hip demonstrate a transcervical right femoral neck fracture which appears foreshortened and angulated approximately 45 degrees. Right femoral head remains located. Bony pelvis appears intact. Left proximal femur appears intact as visualized. IMPRESSION: 1. Acute right-sided angulated transcervical femoral neck fracture, as above. Electronically Signed   By: Vinnie Langton M.D.   On: 11/18/2016 12:04   Scheduled Meds: . aspirin EC  325 mg Oral Q breakfast  . docusate sodium  100 mg Oral BID  . heparin  5,000 Units Subcutaneous Q8H   Continuous Infusions: . sodium chloride 75 mL/hr at 11/18/16 1913    LOS: 1 day   Kerney Elbe, DO [facility] Pager [phone]  If 7PM-7AM, please contact night-coverage [email or web address] Password [email or web address] 11/19/2016, 7:50 AM

## 2016-11-19 NOTE — Evaluation (Signed)
Physical Therapy Evaluation Patient Details Name: Frank Horn MRN: [id number] DOB: Sep 30, 1926 Today's Date: 11/19/2016   History of Present Illness  83 yo male admitted with a R hip fracture after falling at home. S/P R hip hemiarthroplasty-direct anterior 11/18/16. Hx of dementia.     Clinical Impression  On eval, pt required Max assist +2 for mobility. He sat EOB ~2-3 minutes with Min assist. Stood x 2 with a RW ~15-20 seconds each attempt. R LE buckling noted with static standing and with any attempts to take any steps. Pt followed 1 step commands inconsistently. He also had difficulty processing how to use the RW. No family present during session. Recommend SNF for continued rehab.     Follow Up Recommendations SNF    Equipment Recommendations   (TBD at next venue)    Recommendations for Other Services OT consult     Precautions / Restrictions Precautions Precautions: Fall Precaution Comments: monitor O2 sats Restrictions Weight Bearing Restrictions: Yes RLE Weight Bearing: Weight bearing as tolerated      Mobility  Bed Mobility Overal bed mobility: Needs Assistance Bed Mobility: Supine to Sit;Sit to Supine     Supine to sit: Max assist;+2 for physical assistance;+2 for safety/equipment;HOB elevated Sit to supine: Max assist;+2 for physical assistance;+2 for safety/equipment   General bed mobility comments: Assist for trunk and bil LEs. Utilized bedpad for scooting, positioning. Increased time. Multimodal cueing required.  Transfers Overall transfer level: Needs assistance Equipment used: Rolling walker (2 wheeled) Transfers: Sit to/from Stand Sit to Stand: Mod assist;+2 physical assistance;+2 safety/equipment;From elevated surface         General transfer comment: x 2. Assist to place hands on walker, rise, stabilize, control descent. Pt stood statically at EOB. He had difficulty standing on R LE. Buckling noted.   Ambulation/Gait             General  Gait Details: Attempted forward and lateral step. Pt unable to safely perform on today without R LE buckling.  Stairs            Wheelchair Mobility    Modified Rankin (Stroke Patients Only)       Balance Overall balance assessment: Needs assistance;History of Falls   Sitting balance-Leahy Scale: Poor Sitting balance - Comments: Sat EOB ~2-3 mintues with Min assist.      Standing balance-Leahy Scale: Zero                               Pertinent Vitals/Pain Pain Assessment: Faces Faces Pain Scale: Hurts even more Pain Location: R LE with activity Pain Descriptors / Indicators: Grimacing;Guarding Pain Intervention(s): Limited activity within patient's tolerance;Repositioned    Home Living Family/patient expects to be discharged to:: Unsure Living Arrangements: Spouse/significant other               Additional Comments: no family present on eval-pt unable to provide info    Prior Function           Comments: no family present on eval-pt unable to provide info     Hand Dominance        Extremity/Trunk Assessment   Upper Extremity Assessment Upper Extremity Assessment: Defer to OT evaluation    Lower Extremity Assessment Lower Extremity Assessment: Generalized weakness (s/p R hip hemi. Pt had difficulty WBing on R LE. Buckling noted.)    Cervical / Trunk Assessment Cervical / Trunk Assessment: Kyphotic  Communication   Communication: [facility]  Cognition Arousal/Alertness: Awake/alert Behavior During Therapy: Restless Overall Cognitive Status: History of cognitive impairments - at baseline                                 General Comments: followed 1 step commands inconsistently      General Comments      Exercises     Assessment/Plan    PT Assessment Patient needs continued PT services  PT Problem List Decreased strength;Decreased mobility;Decreased activity tolerance;Decreased balance;Decreased knowledge of use  of DME;Pain;Decreased cognition;Decreased safety awareness       PT Treatment Interventions DME instruction;Gait training;Therapeutic activities;Therapeutic exercise;Patient/family education;Balance training;Functional mobility training    PT Goals (Current goals can be found in the Care Plan section)  Acute Rehab PT Goals Patient Stated Goal: none stated.  PT Goal Formulation: Patient unable to participate in goal setting Time For Goal Achievement: 12/03/16 Potential to Achieve Goals: Good    Frequency Min 3X/week   Barriers to discharge        Co-evaluation               End of Session Equipment Utilized During Treatment: Gait belt Activity Tolerance: Patient limited by pain;Patient limited by fatigue (Limited by cognition) Patient left: in bed;with call bell/phone within reach;with bed alarm set;with nursing/sitter in room   PT Visit Diagnosis: Muscle weakness (generalized) (M62.81);Difficulty in walking, not elsewhere classified (R26.2)    Time: 2774-1287 PT Time Calculation (min) (ACUTE ONLY): 22 min   Charges:   PT Evaluation $PT Eval Moderate Complexity: 1 Procedure     PT G Codes:          Weston Anna, MPT Pager: [phone]

## 2016-11-19 NOTE — Progress Notes (Signed)
OT Cancellation Note  Patient Details Name: Frank Horn MRN: [id number] DOB: Mar 10, 1927   Cancelled Treatment:    Reason Eval/Treat Not Completed: Patient not medically ready Therapy holding due to strict bedrest order set. Please update activity as appropriate and OT to continue to follow.  Parke Poisson B 11/19/2016, 7:30 AM

## 2016-11-19 NOTE — Plan of Care (Signed)
Problem: Education: Goal: Knowledge of [facility] Education information/materials will improve Outcome: Completed/Met Date Met: 11/19/16 Pt w/ advanced dementia, unable to educate. Education reviewed w/ pt wife at bedside.   Problem: Pain Managment: Goal: General experience of comfort will improve Outcome: Progressing Pt denies pain on direct questioning, no grimacing or outward signs of pain this AM.   Problem: Physical Regulation: Goal: Ability to maintain clinical measurements within normal limits will improve Outcome: Not Progressing Pt HR elevated 110-120s this AM with bursts to 140s with increased restlessness. MD aware.   Problem: Activity: Goal: Risk for activity intolerance will decrease Outcome: Progressing Pt to EOB w/ PT this AM. Will need SNF placement.

## 2016-11-19 NOTE — Progress Notes (Signed)
Sepsis flag on pt's chart for HR 128, BP 150/98.  Pt w/ advanced dementia, quite restless in bed.  Denies pain when asked directly, no grimacing noted.  HR has been mostly 110-120s this AM with occasional bursts to 140s with increased restlessness. Dr Alfredia Ferguson paged to make aware of sepsis flag.  Will await further orders.

## 2016-11-19 NOTE — Discharge Instructions (Signed)
Dry dressing as needed to right hip (can leave current dressing in place until his outpatient orthopedic follow-up) Full weight bearing as tolerated right hip.

## 2016-11-20 ENCOUNTER — Encounter (HOSPITAL_COMMUNITY): Payer: Self-pay | Admitting: Orthopaedic Surgery

## 2016-11-20 DIAGNOSIS — R740 Nonspecific elevation of levels of transaminase and lactic acid dehydrogenase [LDH]: Secondary | ICD-10-CM

## 2016-11-20 LAB — CBC WITH DIFFERENTIAL/PLATELET
BASOS ABS: 0 10*3/uL (ref 0.0–0.1)
BASOS PCT: 0 %
Eosinophils Absolute: 0.1 10*3/uL (ref 0.0–0.7)
Eosinophils Relative: 1 %
HEMATOCRIT: 27 % — AB (ref 39.0–52.0)
Hemoglobin: 9.1 g/dL — ABNORMAL LOW (ref 13.0–17.0)
LYMPHS PCT: 12 %
Lymphs Abs: 0.9 10*3/uL (ref 0.7–4.0)
MCH: 30.5 pg (ref 26.0–34.0)
MCHC: 33.7 g/dL (ref 30.0–36.0)
MCV: 90.6 fL (ref 78.0–100.0)
Monocytes Absolute: 0.9 10*3/uL (ref 0.1–1.0)
Monocytes Relative: 12 %
NEUTROS ABS: 5.8 10*3/uL (ref 1.7–7.7)
Neutrophils Relative %: 75 %
Platelets: 137 10*3/uL — ABNORMAL LOW (ref 150–400)
RBC: 2.98 MIL/uL — AB (ref 4.22–5.81)
RDW: 14.2 % (ref 11.5–15.5)
WBC: 7.7 10*3/uL (ref 4.0–10.5)

## 2016-11-20 LAB — MAGNESIUM: Magnesium: 1.7 mg/dL (ref 1.7–2.4)

## 2016-11-20 LAB — PHOSPHORUS: PHOSPHORUS: 1.5 mg/dL — AB (ref 2.5–4.6)

## 2016-11-20 LAB — COMPREHENSIVE METABOLIC PANEL
ALBUMIN: 2.7 g/dL — AB (ref 3.5–5.0)
ALK PHOS: 68 U/L (ref 38–126)
ALT: 14 U/L — AB (ref 17–63)
AST: 49 U/L — AB (ref 15–41)
Anion gap: 6 (ref 5–15)
BILIRUBIN TOTAL: 0.9 mg/dL (ref 0.3–1.2)
BUN: 11 mg/dL (ref 6–20)
CO2: 26 mmol/L (ref 22–32)
CREATININE: 0.75 mg/dL (ref 0.61–1.24)
Calcium: 7.8 mg/dL — ABNORMAL LOW (ref 8.9–10.3)
Chloride: 106 mmol/L (ref 101–111)
GFR calc Af Amer: >60 mL/min (ref >60)
GLUCOSE: 116 mg/dL — AB (ref 65–99)
POTASSIUM: 3.7 mmol/L (ref 3.5–5.1)
Sodium: 138 mmol/L (ref 135–145)
Total Protein: 5.1 g/dL — ABNORMAL LOW (ref 6.5–8.1)

## 2016-11-20 MED ORDER — ACETAMINOPHEN 325 MG PO TABS: 650.0000 mg | ORAL_TABLET | Freq: Four times a day (QID) | ORAL | 0 refills | Status: AC | PRN

## 2016-11-20 MED ORDER — POLYETHYLENE GLYCOL 3350 17 G PO PACK: 17.0000 g | PACK | Freq: Every day | ORAL | 0 refills | Status: AC | PRN

## 2016-11-20 MED ORDER — DOCUSATE SODIUM 100 MG PO CAPS: 100.0000 mg | ORAL_CAPSULE | Freq: Two times a day (BID) | ORAL | 0 refills | Status: AC

## 2016-11-20 MED ORDER — DEXTROSE 5 % IV SOLN
30.0000 mmol | Freq: Once | INTRAVENOUS | Status: AC
  Administered 2016-11-20: 30 mmol via INTRAVENOUS
  Filled 2016-11-20: qty 10

## 2016-11-20 MED ORDER — ALUM & MAG HYDROXIDE-SIMETH 200-200-20 MG/5ML PO SUSP: 30.0000 mL | ORAL | 0 refills | Status: DC | PRN

## 2016-11-20 MED ORDER — BISACODYL 10 MG RE SUPP: 10.0000 mg | Freq: Every day | RECTAL | 0 refills | Status: DC | PRN

## 2016-11-20 MED ORDER — HYDROCODONE-ACETAMINOPHEN 5-325 MG PO TABS: 1.0000 | ORAL_TABLET | Freq: Four times a day (QID) | ORAL | 0 refills | Status: AC | PRN

## 2016-11-20 NOTE — Progress Notes (Signed)
Pt transferred to [facility] via [place] at this time.  Pt stable at d/c.  [facility] in possession of d/c instructions, script, transfer packet. Wife took pt's personal belongings.

## 2016-11-20 NOTE — Clinical Social Work Note (Signed)
LCSW obtained authorization from [facility] for patient to go to rehab at [facility] authorization [id number] for 3 days starting today. Next reauth 11/22/16 authorized for level [facility] for 568 therapy minutes. Fax reauth to [phone]. LCSW provided facility with this information and also informed patient's wife who is leaving to go and sign paperwork at facility.  LCSW will arrange [facility] for patient transport.  Dede Query, [place] Worker - Weekend Coverage cell #: [phone]

## 2016-11-20 NOTE — NC FL2 (Signed)
[place] LEVEL OF CARE SCREENING TOOL     IDENTIFICATION  Patient Name: Frank Horn Birthdate: [birth year] Sex: male Admission Date (Current Location): 11/18/2016  [facility] and [place] Number:  Herbalist and Address:  [facility],  [place] [place], [place]      Provider Number: [id number]  Attending Physician Name and Address:  Kerney Elbe, DO  Relative Name and Phone Number:       Current Level of Care: Hospital Recommended Level of Care: [place] Prior Approval Number:    Date Approved/Denied:   PASRR Number: [id number]  Discharge Plan: SNF    Current Diagnoses: Patient Active Problem List   Diagnosis Date Noted  . Hypokalemia 11/18/2016  . Unwitnessed fall 11/18/2016  . AF (paroxysmal atrial fibrillation) ([place]) 11/18/2016  . Hyperlipidemia 11/18/2016  . History of CVA (cerebrovascular accident) 11/18/2016  . Closed right hip fracture, initial encounter ([place])   . Atherosclerosis of lower extremity with intermittent claudication ([place]) 06/26/2014  . 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 Essential hypertension 11/30/2007  . STROKE 11/30/2007  . TRANSIENT ISCHEMIC ATTACK 11/30/2007  . DIVERTICULOSIS, COLON 11/30/2007  . OSTEOARTHRITIS 11/30/2007  . PEPTIC ULCER DISEASE, HX OF 11/30/2007  . NEPHROLITHIASIS, HX OF 11/30/2007  . TONSILLECTOMY, HX OF 11/30/2007  . HEMORRHOIDECTOMY, HX OF 11/30/2007    Orientation RESPIRATION BLADDER Height & Weight     Self  O2 (2L) Incontinent Weight: 145 lb 8.1 oz (66 kg) Height:  5\' 7"  (170.2 cm)  BEHAVIORAL SYMPTOMS/MOOD NEUROLOGICAL BOWEL NUTRITION STATUS        Diet (heart healthy)  AMBULATORY STATUS COMMUNICATION OF NEEDS Skin   Limited Assist Verbally Other (Comment) (Closed incision right hip)                       Personal Care Assistance Level of Assistance  Bathing, Feeding, Dressing Bathing Assistance: Limited assistance Feeding  assistance: Limited assistance Dressing Assistance: Limited assistance     Functional Limitations Info             SPECIAL CARE FACTORS FREQUENCY  PT (By licensed PT), OT (By licensed OT)     PT Frequency: 5 OT Frequency: 5            Contractures      Additional Factors Info  Code Status, Allergies Code Status Info: DNR Allergies Info: LIPITOR ATORVASTATIN            Current Medications (11/20/2016):  This is the current hospital active medication list Current Facility-Administered Medications  Medication Dose Route Frequency Provider Last Rate Last Dose  . 0.9 %  sodium chloride infusion   Intravenous Continuous [place], DO   Stopped at 11/20/16 0900  . acetaminophen (TYLENOL) tablet 650 mg  650 mg Oral Q6H PRN Mcarthur Rossetti, MD       Or  . acetaminophen (TYLENOL) suppository 650 mg  650 mg Rectal Q6H PRN Mcarthur Rossetti, MD      . alum & mag hydroxide-simeth (MAALOX/MYLANTA) 200-200-20 MG/5ML suspension 30 mL  30 mL Oral Q4H PRN Mcarthur Rossetti, MD      . aspirin EC tablet 325 mg  325 mg Oral Q breakfast Mcarthur Rossetti, MD   325 mg at 11/20/16 0720  . bisacodyl (DULCOLAX) suppository 10 mg  10 mg Rectal Daily PRN Bertram Savin Sheikh, DO      . docusate sodium (COLACE) capsule 100 mg  100 mg Oral BID Mcarthur Rossetti, MD   100 mg at 11/20/16 0900  . hydrALAZINE (APRESOLINE) injection 10 mg  10 mg Intravenous Q6H PRN [employer], DO      . HYDROcodone-acetaminophen (NORCO/VICODIN) 5-325 MG per tablet 1-2 tablet  1-2 tablet Oral Q6H PRN Mcarthur Rossetti, MD   1 tablet at 11/20/16 0720  . menthol-cetylpyridinium (CEPACOL) lozenge 3 mg  1 lozenge Oral PRN Mcarthur Rossetti, MD       Or  . phenol (CHLORASEPTIC) mouth spray 1 spray  1 spray Mouth/Throat PRN Mcarthur Rossetti, MD      . metoCLOPramide (REGLAN) tablet 5-10 mg  5-10 mg Oral Q8H PRN Mcarthur Rossetti, MD       Or  . metoCLOPramide  (REGLAN) injection 5-10 mg  5-10 mg Intravenous Q8H PRN Mcarthur Rossetti, MD      . metoprolol (LOPRESSOR) injection 2.5 mg  2.5 mg Intravenous Q6H PRN Bertram Savin Sheikh, DO      . morphine 2 MG/ML injection 0.5 mg  0.5 mg Intravenous Q2H PRN Mcarthur Rossetti, MD   0.5 mg at 11/20/16 0345  . ondansetron (ZOFRAN) tablet 4 mg  4 mg Oral Q6H PRN Mcarthur Rossetti, MD       Or  . ondansetron [facility]) injection 4 mg  4 mg Intravenous Q6H PRN Mcarthur Rossetti, MD      . polyethylene glycol (MIRALAX / GLYCOLAX) packet 17 g  17 g Oral Daily PRN [place], DO   17 g at 11/20/16 0900  . potassium phosphate 30 mmol in dextrose 5 % 500 mL infusion  30 mmol Intravenous Once [employer], DO   30 mmol at 11/20/16 0900     Discharge Medications: Please see discharge summary for a list of discharge medications.  Relevant Imaging Results:  Relevant Lab Results:   Additional Information SS#: [id number]  Carlean Jews, LCSW

## 2016-11-20 NOTE — Evaluation (Signed)
Occupational Therapy Evaluation Patient Details Name: Frank Horn MRN: [id number] DOB: 10-08-1926 Today's Date: 11/20/2016    History of Present Illness 83 yo male admitted with a R hip fracture after falling at home. S/P R hip hemiarthroplasty-direct anterior 11/18/16. Hx of dementia.    Clinical Impression   Pt admitted with R hip fx in which he had surgery.. Pt currently with functional limitations due to the deficits listed below (see OT Problem List).  Pt will benefit from skilled OT to increase their safety and independence with ADL and functional mobility for ADL to facilitate discharge to venue listed below.     Follow Up Recommendations  SNF - pt will benefit from [facility].Pt did well with OT this session.   Equipment Recommendations  None recommended by OT       Precautions / Restrictions Precautions Precautions: Fall Restrictions Weight Bearing Restrictions: No RLE Weight Bearing: Weight bearing as tolerated      Mobility Bed Mobility Overal bed mobility: Needs Assistance Bed Mobility: Supine to Sit;Sit to Supine     Supine to sit: Mod assist;HOB elevated Sit to supine: Mod assist      Transfers Overall transfer level: Needs assistance   Transfers: Sit to/from Stand Sit to Stand: +2 physical assistance;Mod assist              Balance Overall balance assessment: Needs assistance Sitting-balance support: Feet supported;Bilateral upper extremity supported Sitting balance-Leahy Scale: Fair     Standing balance support: Bilateral upper extremity supported Standing balance-Leahy Scale: Poor                             ADL either performed or assessed with clinical judgement   ADL Overall ADL's : Needs assistance/impaired Eating/Feeding: Sitting;Moderate assistance   Grooming: Moderate assistance;Sitting;Cueing for safety;Cueing for sequencing                                       Vision Patient Visual Report: No  change from baseline              Pertinent Vitals/Pain Pain Assessment: No/denies pain     Hand Dominance     Extremity/Trunk Assessment Upper Extremity Assessment Upper Extremity Assessment: Generalized weakness              Cognition Arousal/Alertness: Awake/alert Behavior During Therapy: WFL for tasks assessed/performed Overall Cognitive Status: History of cognitive impairments - at baseline                                                Home Living Family/patient expects to be discharged to:: Skilled nursing facility                                                 OT Problem List: Decreased strength;Decreased activity tolerance;Decreased knowledge of use of DME or AE;Decreased safety awareness;Decreased cognition      OT Treatment/Interventions: Self-care/ADL training;DME and/or AE instruction;Patient/family education;Therapeutic activities    OT Goals(Current goals can be found in the care plan section) Acute Rehab OT Goals Patient Stated Goal: to rehab per  wife OT Goal Formulation: With patient Time For Goal Achievement: 11/27/16 Potential to Achieve Goals: Good  OT Frequency: Min 2X/week   Barriers to D/C:               End of Session Equipment Utilized During Treatment: Rolling walker;Oxygen Nurse Communication: Mobility status  Activity Tolerance: Patient tolerated treatment well Patient left: in chair  OT Visit Diagnosis: Unsteadiness on feet (R26.81);Other symptoms and signs involving cognitive function;Muscle weakness (generalized) (M62.81)                Time: 3716-9678 OT Time Calculation (min): 24 min Charges:  OT General Charges $OT Visit: 1 Procedure OT Evaluation $OT Eval Moderate Complexity: 1 Procedure OT Treatments $Self Care/Home Management : 8-22 mins G-Codes:     Kari Baars, OT [phone]  Payton Mccallum D 11/20/2016, 2:53 PM

## 2016-11-20 NOTE — Progress Notes (Signed)
This Probation officer spoke w/ Dr Ninfa Linden who states pt is okay for d/c to rehab from his standpoint.  States he does not need to see pt prior to d/c.  Verbal order to change aquacell dressing to rt hip received.

## 2016-11-20 NOTE — Clinical Social Work Placement (Signed)
   CLINICAL SOCIAL WORK PLACEMENT  NOTE  Date:  11/20/2016  Patient Details  Name: Frank Horn MRN: [id number] Date of Birth: [birth year]  Clinical Social Work is seeking post-discharge placement for this patient at the [place] level of care (*CSW will initial, date and re-position this form in  chart as items are completed):  Yes   Patient/family provided with [place] Work Department's list of facilities offering this level of care within the geographic area requested by the patient (or if unable, by the patient's family).  Yes   Patient/family informed of their freedom to choose among providers that offer the needed level of care, that participate in Medicare, Medicaid or managed care program needed by the patient, have an available bed and are willing to accept the patient.      Patient/family informed of [facility]'s ownership interest in [facility] and [facility], as well as of the fact that they are under no obligation to receive care at these facilities.  PASRR submitted to EDS on 11/20/16     PASRR number received on 11/20/16     Existing PASRR number confirmed on       FL2 transmitted to all facilities in geographic area requested by pt/family on       FL2 transmitted to all facilities within larger geographic area on 11/20/16     Patient informed that his/her managed care company has contracts with or will negotiate with certain facilities, including the following:        Yes   Patient/family informed of bed offers received.  Patient chooses bed at  [facility])     Physician recommends and patient chooses bed at      Patient to be transferred to  ([facility]) on 11/20/16.  Patient to be transferred to facility by  [facility])     Patient family notified on   of transfer.  Name of family member notified:   (wife elaine)     PHYSICIAN       Additional Comment:     _______________________________________________ Carlean Jews, LCSW 11/20/2016, 3:18 PM

## 2016-11-20 NOTE — Care Management Note (Signed)
Case Management Note  Patient Details  Name: Frank Horn MRN: [id number] Date of Birth: 07-26-1927  Subjective/Objective:                    Action/Plan:dc SNF.   Expected Discharge Date:  11/20/16               Expected Discharge Plan:  Skilled Nursing Facility  In-House Referral:  Clinical Social Work  Discharge planning Services  CM Consult  Post Acute Care Choice:    Choice offered to:     DME Arranged:    DME Agency:     HH Arranged:    [place] Agency:     Status of Service:  Completed, signed off  If discussed at [employer], dates discussed:    Additional Comments:  Dessa Phi, RN 11/20/2016, 1:38 PM

## 2016-11-20 NOTE — Care Management Note (Signed)
Case Management Note  Patient Details  Name: Frank Horn MRN: [id number] Date of Birth: 03-23-1927  Subjective/Objective:83 y/o m admitted w/R hip fx. From home. 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 CSW following for SNF.                   Action/Plan:d/c SNF.   Expected Discharge Date:                  Expected Discharge Plan:  Skilled Nursing Facility  In-House Referral:  Clinical Social Work  Discharge planning Services  CM Consult  Post Acute Care Choice:    Choice offered to:     DME Arranged:    DME Agency:     HH Arranged:    [place] Agency:     Status of Service:  In process, will continue to follow  If discussed at Long Length of Stay Meetings, dates discussed:    Additional Comments:  Dessa Phi, RN 11/20/2016, 10:43 AM

## 2016-11-20 NOTE — Discharge Summary (Signed)
Physician Discharge Summary  Frank Horn JJO:[id number] DOB: 05-13-1927 DOA: 11/18/2016  PCP: Melinda Crutch, MD  Admit date: 11/18/2016 Discharge date: 11/20/2016  Admitted From: Home Disposition:  SNF  Recommendations for Outpatient Follow-up:  1. Follow up with PCP in 1-2 weeks 2. Follow up with Orthopedics Dr. Ninfa Linden as an outpatient in 2 weeks 3. Please obtain CMP/CBC within one week  Home Health: No Equipment/Devices: TBD by SNF  Discharge Condition: Stable CODE STATUS: DO NOT RESUSCITATE Diet recommendation: Heart Healthy   Brief/Interim Summary: Frank R Simmonsis a 83 y.o.malewith medical history significant of Atrial Fibrillation not on any anticoagulation because of prior hemorrhagic stroke, Hx of Ischemic Stroke, HTN, HLD, Polymyalgia and other comorbids who was brought to [facility] after a fall. Patient is severely demented and history is limited so most of the history was obtained by family. Per family patient had an unwitnessed fall the morning of admission around 4:00 am and was on the floor. Patient's daughter helped him up and he couldn't really walk or bear weight so was brought to [facility] for evaluation. Family stated that the patient never endorsed any dizziness, lightheadedness, nausea, vomiting. He had no complaints except some pain. Was worked up and found to have a Right Hip Fracture. TRH was asked to admit for Hip Fracture. Patient underwent Anterior Hip Arthroplasty 4/14. PT evaluated and recommending SNF. Patient steadily improved and had no issues except being severely demented and intermittent bursts of tachycardia from pain. Hemoglobin dropped from 12.6 -> 9.1 after surgery but was also in part because of the IVF patient was getting. Orthopedics Dr. Ninfa Linden stated it was ok to d/c from his point and at this time is deemed stable to be transferred to SNF where he will continue his therapies.   Discharge Diagnoses:  Principal Problem:   Closed right hip fracture,  initial encounter [facility]) Active Problems:   Essential hypertension   Hypokalemia   Unwitnessed fall   AF (paroxysmal atrial fibrillation) (HCC)   Hyperlipidemia   History of CVA (cerebrovascular accident)  Acute right-sided angulated transcervical femoral neck fracture s/p Right Hip Hemiarthroplasty POD2 -Hip X-Ray showed Multiple views of the pelvis and right hip demonstrate a transcervical right femoral neck fracture which appears foreshortened and angulated approximately 45 degrees. Right femoral head remains located. Bony pelvis appears intact. Left proximal femur appears intact as visualized. -Hip Fracture order set utilized -Dr. Marciano Sequin in Orthopedics consulted and further management per Ortho; Patient underwent Surgery on 11/18/16 -C/w Pain Control with Home Ibuprofen, Hydrocodone-Acetaminophen 1-2 Tab po q6hprn, and Acetaminophen 650 mg po q6hprn; -Bowel Regimen with Docusate 100 mg po BID and Miralax Daily prn; If needed will need Bisacodyl Suppository -D/C'd IVF with NS at 100 mL/hr -Dressing Changes per Ortho Recc's -PT Recommending SNF  ABLA -Likely in combination with Dilutional Drop; IVF now D/C'd -Patient's Hb/Hct went from 12.6/37.2 -> 9.9/29.1 -> 9.1/27.0 -Continue to Monitor for S/Sx of Bleeding -Repeat CBC at SNF  Unwitnessed Fall -Checked Head CT to r/o intracranial bleed (Has a hx of hemorrhagic CVA) showed no acute intracranial abnormality -Monitored with Neurochecks -Continue to Reorient because of Dementia  Hypertension  -Likely 2/2 to Pain -C/w Pain Control Ibuprofen and with Hydrocodone-Acetaminophen 1-2 Tab po q6hprn; -Per Family no longer taking any BP meds and does not take Ramipril  -Add Hydralazine when needed but BP improved after Pain control  Mild Hypokalemia -K+ was 3.4 and improved to 3.7 -Replete with 30 mmol of K Phos today  -Re[facility] CMP at [facility]  Hypophosphetemia -Patient's Phos Level was 1.7 -Replete with IV K Phos 30 mmol -Repeat  Phos at SNF  Paroxysmal Atrial Fibrillation -Placed on Telemetry while Hospitalized -D/C'd IV Fluids at100 mL/hr -Was Tachycardic with bursts into the 140's but improved with Pain Control -Not on Anticoagulation given history of Hemorrhagic CVA -C/w ASA 325 mg po Daily with Breakfast from Ortho for DVT Prophylaxis -If continues to have Tachycardia may need to increase Pain meds and add low dose BB -Monitor at SNF  Hyperlipidemia -Cannot take Statins because of Myalgias -No Acute Issues  Hx of CVA (Hemorrhagic and Ischemic) -No active issue -Head CT showed encephalomalacia sequela from prior hemorrhage in left posterior parietal and left occipital lobe -Now on ASA 325 mg po Daily per Ortho  Mildly elevated AST -AST was elevated at 49 -Repeat CMP at [facility]  Discharge Instructions  Discharge Instructions    Call MD for:  difficulty breathing, headache or visual disturbances    Complete by:  As directed    Call MD for:  extreme fatigue    Complete by:  As directed    Call MD for:  hives    Complete by:  As directed    Call MD for:  persistant dizziness or light-headedness    Complete by:  As directed    Call MD for:  persistant nausea and vomiting    Complete by:  As directed    Call MD for:  redness, tenderness, or signs of infection (pain, swelling, redness, odor or green/yellow discharge around incision site)    Complete by:  As directed    Call MD for:  severe uncontrolled pain    Complete by:  As directed    Call MD for:  temperature >100.4    Complete by:  As directed    Diet - low sodium heart healthy    Complete by:  As directed    Discharge instructions    Complete by:  As directed    Follow up Care at SNF for Rehab and follow up with Orthopedics Dr. Ninfa Linden as an outpatient. Take all medications as prescribed. If symptoms change or worsen please return to the ED for evaluation.   Full weight bearing    Complete by:  As directed    Increase activity slowly     Complete by:  As directed      Allergies as of 11/20/2016      Reactions   Lipitor [atorvastatin]    myalgias      Medication List    TAKE these medications   acetaminophen 325 MG tablet Commonly known as:  TYLENOL Take 2 tablets (650 mg total) by mouth every 6 (six) hours as needed for mild pain (or Fever >/= 101).   alum & mag hydroxide-simeth 200-200-20 MG/5ML suspension Commonly known as:  MAALOX/MYLANTA Take 30 mLs by mouth every 4 (four) hours as needed for indigestion.   aspirin 325 MG EC tablet Take 1 tablet (325 mg total) by mouth daily with breakfast.   bisacodyl 10 MG suppository Commonly known as:  DULCOLAX Place 1 suppository (10 mg total) rectally daily as needed for moderate constipation.   docusate sodium 100 MG capsule Commonly known as:  COLACE Take 1 capsule (100 mg total) by mouth 2 (two) times daily.   HYDROcodone-acetaminophen 5-325 MG tablet Commonly known as:  NORCO/VICODIN Take 1-2 tablets by mouth every 6 (six) hours as needed for moderate pain.   IBUPROFEN PO Take 1 tablet by mouth as needed.  polyethylene glycol packet Commonly known as:  MIRALAX / GLYCOLAX Take 17 g by mouth daily as needed for mild constipation.   traZODone 50 MG tablet Commonly known as:  DESYREL Take 25-100 mg by mouth at bedtime.      Follow-up Information    Mcarthur Rossetti, MD. Schedule an appointment as soon as possible for a visit in 2 week(s).   Specialty:  Orthopedic Surgery Contact information: [address] [place] [address] [phone]          Allergies  Allergen Reactions  . Lipitor [Atorvastatin]     myalgias   Consultations:  Orthopedics Dr. Ninfa Linden  Procedures/Studies: Dg Ankle Complete Right  Result Date: 11/18/2016 CLINICAL DATA:  83 year old male with history of trauma from a fall today complaining of right ankle pain. EXAM: RIGHT ANKLE - COMPLETE 3+ VIEW COMPARISON:  No priors. FINDINGS: Multiple views  of the right ankle demonstrate no acute displaced fracture, subluxation, dislocation, or soft tissue abnormality. IMPRESSION: No acute radiographic abnormality of the right ankle. Electronically Signed   By: Vinnie Langton M.D.   On: 11/18/2016 12:05   Ct Head Wo Contrast  Result Date: 11/18/2016 CLINICAL DATA:  Fall getting out of shower, right hip deformity EXAM: CT HEAD WITHOUT CONTRAST TECHNIQUE: Contiguous axial images were obtained from the base of the skull through the vertex without intravenous contrast. COMPARISON:  03/27/2010 FINDINGS: Brain: No intracranial hemorrhage, mass effect or midline shift. Moderate cerebral atrophy. There is periventricular an extensive patchy subcortical white matter decreased attenuation probable due to chronic small vessel ischemic changes. Small lacunar infarct in left basal ganglia. Stable encephalomalacia sequela from prior hemorrhage in left posterior parietal lobe and occipital lobe. No definite acute cortical infarction. No mass lesion is noted on this unenhanced scan. Vascular: Mild atherosclerotic calcifications of carotid siphon Skull: No skull fracture is noted. Sinuses/Orbits: No acute findings Other: None IMPRESSION: No acute intracranial abnormality. Stable cerebral atrophy. Moderate periventricular and patchy subcortical white matter decreased attenuation probable due to chronic small vessel ischemic changes. There is encephalomalacia sequela from prior hemorrhage in left posterior parietal and left occipital lobe. No definite acute cortical infarction. Electronically Signed   By: Lahoma Crocker M.D.   On: 11/18/2016 14:55   Pelvis Portable  Result Date: 11/18/2016 CLINICAL DATA:  Right hip arthroplasty. EXAM: PORTABLE PELVIS 1-2 VIEWS COMPARISON:  None. FINDINGS: Single AP view pelvis demonstrates right hip arthroplasty, without acute hardware complication. Mild to moderate left hip osteoarthritis. Surgical staples superficial to the joint. IMPRESSION:  Expected appearance after right hip arthroplasty. Electronically Signed   By: Abigail Miyamoto M.D.   On: 11/18/2016 18:50   Dg Chest Port 1 View  Result Date: 11/18/2016 CLINICAL DATA:  Preoperative examination prior to hip surgery. EXAM: PORTABLE CHEST 1 VIEW COMPARISON:  03/24/2010 FINDINGS: Cardiomegaly identified. There is no evidence of focal airspace disease, pulmonary edema, suspicious pulmonary nodule/mass, pleural effusion, or pneumothorax. No acute bony abnormalities are identified. IMPRESSION: Cardiomegaly without evidence of acute cardiopulmonary disease. Electronically Signed   By: Margarette Canada M.D.   On: 11/18/2016 13:10   Dg C-arm 1-60 Min-no Report  Result Date: 11/18/2016 Fluoroscopy was utilized by the requesting physician.  No radiographic interpretation.   Dg Hip Operative Unilat W Or W/o Pelvis Right  Result Date: 11/18/2016 CLINICAL DATA:  Right hip replacement common known right hip fracture EXAM: OPERATIVE RIGHT HIP WITH PELVIS COMPARISON:  11/18/2016 FLUOROSCOPY TIME:  Radiation Exposure Index (as provided by the fluoroscopic device): 0.72 mGy  If the device does not provide the exposure index: Fluoroscopy Time:  10.6 seconds Number of Acquired Images:  4 FINDINGS: Right hip fracture is again seen on the initial images. Right hip prosthesis is subsequent shown in satisfactory position. No acute abnormality noted. IMPRESSION: Status post right hip replacement Electronically Signed   By: Inez Catalina M.D.   On: 11/18/2016 18:46   Dg Hip Unilat  With Pelvis 2-3 Views Right  Result Date: 11/18/2016 CLINICAL DATA:  83 year old male with history of trauma from a fall at home today complaining of pain in the right hip. EXAM: DG HIP (WITH OR WITHOUT PELVIS) 2-3V RIGHT COMPARISON:  No priors. FINDINGS: Multiple views of the pelvis and right hip demonstrate a transcervical right femoral neck fracture which appears foreshortened and angulated approximately 45 degrees. Right femoral head  remains located. Bony pelvis appears intact. Left proximal femur appears intact as visualized. IMPRESSION: 1. Acute right-sided angulated transcervical femoral neck fracture, as above. Electronically Signed   By: Vinnie Langton M.D.   On: 11/18/2016 12:04   Subjective: Seen and examined and was pleasantly demented. No Nausea or vomiting and pain better controlled. No acute issues and ready to be D/C'd to SNF.  Discharge Exam: Vitals:   11/20/16 0059 11/20/16 0345  BP: 131/87 (!) 148/82  Pulse: (!) 103 (!) 103  Resp: 16 18  Temp:  98.4 F (36.9 C)   Vitals:   11/19/16 1445 11/19/16 1830 11/20/16 0059 11/20/16 0345  BP: 139/80 121/81 131/87 (!) 148/82  Pulse: (!) 116 98 (!) 103 (!) 103  Resp: 16 17 16 18   Temp: 98.5 F (36.9 C) 99 F (37.2 C)  98.4 F (36.9 C)  TempSrc: Oral Oral Axillary Axillary  SpO2: 93% 97% 93% 93%  Weight:      Height:       General: Pt is awake but not alert, not in acute distress Cardiovascular: Irregularly Irregular, S1/S2 +, no rubs, no gallops Respiratory: CTA bilaterally, no wheezing, no rhonchi Abdominal: Soft, NT, ND, bowel sounds + Extremities: no edema, no cyanosis; Right Hip incision appeared C/D/I with some dried blood.  The results of significant diagnostics from this hospitalization (including imaging, microbiology, ancillary and laboratory) are listed below for reference.    Microbiology: No results found for this or any previous visit (from the past 240 hour(s)).   Labs: BNP (last 3 results) No results for input(s): BNP in the last 8760 hours. Basic Metabolic Panel:  Recent Labs Lab 11/18/16 1230 11/19/16 0514 11/20/16 0525  NA 140 141 138  K 3.4* 4.0 3.7  CL 105 105 106  CO2 28 28 26   GLUCOSE 133* 138* 116*  BUN 13 11 11   CREATININE 0.89 0.98 0.75  CALCIUM 9.3 8.0* 7.8*  MG  --   --  1.7  PHOS  --   --  1.5*   Liver Function Tests:  Recent Labs Lab 11/20/16 0525  AST 49*  ALT 14*  ALKPHOS 68  BILITOT 0.9  PROT  5.1*  ALBUMIN 2.7*   No results for input(s): LIPASE, AMYLASE in the last 168 hours. No results for input(s): AMMONIA in the last 168 hours. CBC:  Recent Labs Lab 11/18/16 1230 11/19/16 0514 11/20/16 0525  WBC 8.5 8.3 7.7  NEUTROABS 7.4  --  5.8  HGB 12.6* 9.9* 9.1*  HCT 37.2* 29.1* 27.0*  MCV 87.5 90.7 90.6  PLT 168 157 137*   Cardiac Enzymes: No results for input(s): CKTOTAL, CKMB, CKMBINDEX, TROPONINI in the  last 168 hours. BNP: Invalid input(s): POCBNP CBG: No results for input(s): GLUCAP in the last 168 hours. D-Dimer No results for input(s): DDIMER in the last 72 hours. Hgb A1c No results for input(s): HGBA1C in the last 72 hours. Lipid Profile No results for input(s): CHOL, HDL, LDLCALC, TRIG, CHOLHDL, LDLDIRECT in the last 72 hours. Thyroid function studies No results for input(s): TSH, T4TOTAL, T3FREE, THYROIDAB in the last 72 hours.  Invalid input(s): FREET3 Anemia work up No results for input(s): VITAMINB12, FOLATE, FERRITIN, TIBC, IRON, RETICCTPCT in the last 72 hours. Urinalysis    Component Value Date/Time   COLORURINE YELLOW 03/24/2010 1953   APPEARANCEUR CLEAR 03/24/2010 1953   LABSPEC 1.010 03/24/2010 1953   PHURINE 7.5 03/24/2010 1953   GLUCOSEU NEGATIVE 03/24/2010 1953   HGBUR NEGATIVE 03/24/2010 1953   BILIRUBINUR NEGATIVE 03/24/2010 1953   KETONESUR NEGATIVE 03/24/2010 1953   PROTEINUR NEGATIVE 03/24/2010 1953   UROBILINOGEN 0.2 03/24/2010 1953   NITRITE NEGATIVE 03/24/2010 1953   LEUKOCYTESUR  03/24/2010 1953    NEGATIVE MICROSCOPIC NOT DONE ON URINES WITH NEGATIVE PROTEIN, BLOOD, LEUKOCYTES, NITRITE, OR GLUCOSE <1000 mg/dL.   Sepsis Labs Invalid input(s): PROCALCITONIN,  WBC,  LACTICIDVEN Microbiology No results found for this or any previous visit (from the past 240 hour(s)).  Time coordinating discharge: 35 minutes  SIGNED:  Kerney Elbe, DO [facility] 11/20/2016, 1:23 PM Pager [phone]  If 7PM-7AM, please  contact night-coverage [email or web address] Password [email or web address]

## 2016-11-20 NOTE — Clinical Social Work Note (Signed)
Clinical Social Work Assessment  Patient Details  Name: Frank Horn MRN: [id number] Date of Birth: March 30, 1927  Date of referral:  11/20/16               Reason for consult:  Facility Placement                Permission sought to share information with:  Family Supports, Chartered certified accountant granted to share information::     Name::        Agency::     Relationship::     Contact Information:     Housing/Transportation Living arrangements for the past 2 months:  Single Family Home Source of Information:  Spouse Patient Interpreter Needed:  None Criminal Activity/Legal Involvement Pertinent to Current Situation/Hospitalization:    Significant Relationships:  Adult Children, Spouse Lives with:  Spouse Do you feel safe going back to the place where you live?    Need for family participation in patient care:  Yes (Comment)  Care giving concerns:  None reported   Facilities manager / plan:  LCSW met with patient and his spouse at bedside to assess for services.  Patient was sedated as he had just been given pain medications that help him sleep.  Patient's wife reported that patient lived with her and they had no help in the home prior to patient's fall and hospitalization.  Patient's spouse reported that the dementia is a new diagnosis for patient since November of last year.  Patient's wife would like [facility] for his rehab needs.  LCSW sent information through the hub and also sent change of condition information through [facility] system.  [facility] went into manual screening, LCSW will follow up in this system.  Patient's information was faxed to [facility] for rehab authorization.  LCSW will follow up on authorization.    Employment status:  Retired Nurse, adult PT Recommendations:  [place] / Referral to community resources:     Patient/Family's Response to care:  Patient sedated due to pain  medication given to help him relax.  Patient/Family's Understanding of and Emotional Response to Diagnosis, Current Treatment, and Prognosis:  Patient has dementia.  Patient's wife understands need for rehab and stated she had been taking care of patient alone at home and worried about future after rehab.   Emotional Assessment Appearance:  Appears younger than stated age Attitude/Demeanor/Rapport:  Sedated Affect (typically observed):  Unable to Assess Orientation:  Oriented to Self Alcohol / Substance use:    Psych involvement (Current and /or in the community):  No (Comment)  Discharge Needs  Concerns to be addressed:    Readmission within the last 30 days:    Current discharge risk:    Barriers to Discharge:  No Barriers Identified   Carlean Jews, LCSW 11/20/2016, 11:26 AM

## 2016-11-20 NOTE — Clinical Social Work Note (Signed)
Patient transport set up for 3;30pm to discharge [facility] room [place].  Report call [phone].   Marland KitchenDede Query, LCSW [facility] Clinical Social Worker - Weekend Coverage cell #: [phone]

## 2016-11-20 NOTE — Progress Notes (Signed)
Report called to [place] at [facility]. Pt wife aware of transfer to rehab and has already headed over there.

## 2016-11-20 NOTE — Progress Notes (Signed)
Physical Therapy Treatment Patient Details Name: Frank Horn MRN: [id number] DOB: 1926/12/05 Today's Date: 11/20/2016    History of Present Illness 83 yo male admitted with a R hip fracture after falling at home. S/P R hip hemiarthroplasty-direct anterior 11/18/16. Hx of dementia.     PT Comments    Progressing slowly with mobility. Wife was present during session on today. Pt was able to walk ~20 feet with Mod assist +2. Distance is limited by pain, fatigue. Recommend SNF for continued rehab.    Follow Up Recommendations  SNF     Equipment Recommendations   (TBD at next venue)    Recommendations for Other Services       Precautions / Restrictions Precautions Precautions: Fall Restrictions Weight Bearing Restrictions: No RLE Weight Bearing: Weight bearing as tolerated    Mobility  Bed Mobility Overal bed mobility: Needs Assistance Bed Mobility: Supine to Sit     Supine to sit: Mod assist;+2 for physical assistance;+2 for safety/equipment;HOB elevated     General bed mobility comments: Assist for trunk and bil LEs. Utilized bedpad for scooting, positioning. Increased time. Multimodal cueing required.  Transfers Overall transfer level: Needs assistance Equipment used: Rolling walker (2 wheeled) Transfers: Sit to/from Stand Sit to Stand: Mod assist;+2 physical assistance;+2 safety/equipment         General transfer comment: Assist to place hands on walker, rise, stabilize, control descent. Multimodal cues for hand placement, safety, technique.   Ambulation/Gait Ambulation/Gait assistance: Mod assist;+2 physical assistance;+2 safety/equipment Ambulation Distance (Feet): 20 Feet Assistive device: Rolling walker (2 wheeled) Gait Pattern/deviations: Step-to pattern;Wide base of support     General Gait Details: Max multimodal cues for safety, technique, sequence, safe use of RW. Assist needed to advance R LE intermittently, maneuver safely with RW. Pt had  difficulty with sequencing.    Stairs            Wheelchair Mobility    Modified Rankin (Stroke Patients Only)       Balance Overall balance assessment: Needs assistance Sitting-balance support: Feet supported;Bilateral upper extremity supported Sitting balance-Leahy Scale: Fair     Standing balance support: Bilateral upper extremity supported Standing balance-Leahy Scale: Poor                              Cognition Arousal/Alertness: Awake/alert Behavior During Therapy: WFL for tasks assessed/performed Overall Cognitive Status: History of cognitive impairments - at baseline                                 General Comments: followed 1 step commands inconsistently      Exercises      General Comments        Pertinent Vitals/Pain Pain Assessment: Faces Faces Pain Scale: Hurts even more Pain Location: R LE with activity Pain Descriptors / Indicators: Grimacing;Guarding Pain Intervention(s): Limited activity within patient's tolerance;Repositioned;Ice applied    Home Living                      Prior Function            PT Goals (current goals can now be found in the care plan section) Progress towards PT goals: Progressing toward goals (slowly)    Frequency    Min 3X/week      PT Plan Current plan remains appropriate    Co-evaluation  End of Session Equipment Utilized During Treatment: Gait belt Activity Tolerance: Patient limited by fatigue;Patient limited by pain Patient left: in chair;with call bell/phone within reach;with nursing/sitter in room;with family/visitor present Nurse Communication:  (made family, sitter, NT aware that pt will need +2 assist for back to bed) PT Visit Diagnosis: Muscle weakness (generalized) (M62.81);Difficulty in walking, not elsewhere classified (R26.2)     Time: 1438-8875 PT Time Calculation (min) (ACUTE ONLY): 18 min  Charges:  $Gait Training: 8-22  mins                    G Codes:          Weston Anna, MPT Pager: [phone]

## 2016-11-20 NOTE — NC FL2 (Signed)
[place] LEVEL OF CARE SCREENING TOOL     IDENTIFICATION  Patient Name: Frank Horn Birthdate: 23-Dec-1926 Sex: male Admission Date (Current Location): 11/18/2016  [facility] and [place] Number:  Herbalist and Address:  [facility],  [address] [place], [place]      Provider Number: [id number]  Attending Physician Name and Address:  Kerney Elbe, DO  Relative Name and Phone Number:       Current Level of Care: Hospital Recommended Level of Care: [place] Prior Approval Number:    Date Approved/Denied:   PASRR Number:   [id number]   Discharge Plan: SNF    Current Diagnoses: Patient Active Problem List   Diagnosis Date Noted  . Hypokalemia 11/18/2016  . Unwitnessed fall 11/18/2016  . AF (paroxysmal atrial fibrillation) ([place]) 11/18/2016  . Hyperlipidemia 11/18/2016  . History of CVA (cerebrovascular accident) 11/18/2016  . Closed right hip fracture, initial encounter ([place])   . Atherosclerosis of lower extremity with intermittent claudication ([place]) 06/26/2014  . 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 Essential hypertension 11/30/2007  . STROKE 11/30/2007  . TRANSIENT ISCHEMIC ATTACK 11/30/2007  . DIVERTICULOSIS, COLON 11/30/2007  . OSTEOARTHRITIS 11/30/2007  . PEPTIC ULCER DISEASE, HX OF 11/30/2007  . NEPHROLITHIASIS, HX OF 11/30/2007  . TONSILLECTOMY, HX OF 11/30/2007  . HEMORRHOIDECTOMY, HX OF 11/30/2007    Orientation RESPIRATION BLADDER Height & Weight     Self  O2 (2L) Incontinent Weight: 145 lb 8.1 oz (66 kg) Height:  5\' 7"  (170.2 cm)  BEHAVIORAL SYMPTOMS/MOOD NEUROLOGICAL BOWEL NUTRITION STATUS        Diet (heart healthy)  AMBULATORY STATUS COMMUNICATION OF NEEDS Skin   Limited Assist Verbally Other (Comment) (Closed incision right hip)                       Personal Care Assistance Level of Assistance  Bathing, Feeding, Dressing Bathing Assistance: Limited  assistance Feeding assistance: Limited assistance Dressing Assistance: Limited assistance     Functional Limitations Info             SPECIAL CARE FACTORS FREQUENCY  PT (By licensed PT), OT (By licensed OT)     PT Frequency: 5 OT Frequency: 5            Contractures      Additional Factors Info  Code Status, Allergies Code Status Info: DNR Allergies Info: LIPITOR ATORVASTATIN            Current Medications (11/20/2016):  This is the current hospital active medication list Current Facility-Administered Medications  Medication Dose Route Frequency Provider Last Rate Last Dose  . 0.9 %  sodium chloride infusion   Intravenous Continuous [place], DO   Stopped at 11/20/16 0900  . acetaminophen (TYLENOL) tablet 650 mg  650 mg Oral Q6H PRN Mcarthur Rossetti, MD       Or  . acetaminophen (TYLENOL) suppository 650 mg  650 mg Rectal Q6H PRN Mcarthur Rossetti, MD      . alum & mag hydroxide-simeth (MAALOX/MYLANTA) 200-200-20 MG/5ML suspension 30 mL  30 mL Oral Q4H PRN Mcarthur Rossetti, MD      . aspirin EC tablet 325 mg  325 mg Oral Q breakfast Mcarthur Rossetti, MD   325 mg at 11/20/16 0720  . bisacodyl (DULCOLAX) suppository 10 mg  10 mg Rectal Daily PRN Bertram Savin Sheikh, DO      . docusate sodium (COLACE) capsule  100 mg  100 mg Oral BID Mcarthur Rossetti, MD   100 mg at 11/20/16 0900  . hydrALAZINE (APRESOLINE) injection 10 mg  10 mg Intravenous Q6H PRN [employer], DO      . HYDROcodone-acetaminophen (NORCO/VICODIN) 5-325 MG per tablet 1-2 tablet  1-2 tablet Oral Q6H PRN Mcarthur Rossetti, MD   1 tablet at 11/20/16 0720  . menthol-cetylpyridinium (CEPACOL) lozenge 3 mg  1 lozenge Oral PRN Mcarthur Rossetti, MD       Or  . phenol (CHLORASEPTIC) mouth spray 1 spray  1 spray Mouth/Throat PRN Mcarthur Rossetti, MD      . metoCLOPramide (REGLAN) tablet 5-10 mg  5-10 mg Oral Q8H PRN Mcarthur Rossetti, MD       Or  .  metoCLOPramide (REGLAN) injection 5-10 mg  5-10 mg Intravenous Q8H PRN Mcarthur Rossetti, MD      . metoprolol (LOPRESSOR) injection 2.5 mg  2.5 mg Intravenous Q6H PRN Bertram Savin Sheikh, DO      . morphine 2 MG/ML injection 0.5 mg  0.5 mg Intravenous Q2H PRN Mcarthur Rossetti, MD   0.5 mg at 11/20/16 0345  . ondansetron (ZOFRAN) tablet 4 mg  4 mg Oral Q6H PRN Mcarthur Rossetti, MD       Or  . ondansetron [facility]) injection 4 mg  4 mg Intravenous Q6H PRN Mcarthur Rossetti, MD      . polyethylene glycol (MIRALAX / GLYCOLAX) packet 17 g  17 g Oral Daily PRN [place], DO   17 g at 11/20/16 0900  . potassium phosphate 30 mmol in dextrose 5 % 500 mL infusion  30 mmol Intravenous Once [employer], DO   30 mmol at 11/20/16 0900     Discharge Medications: Please see discharge summary for a list of discharge medications.  Relevant Imaging Results:  Relevant Lab Results:   Additional Information SS#: [id number]  [place] Anna, LCSW

## 2016-11-21 ENCOUNTER — Encounter: Payer: Self-pay | Admitting: Adult Health

## 2016-11-21 ENCOUNTER — Non-Acute Institutional Stay (SKILLED_NURSING_FACILITY): Payer: Medicare Other | Admitting: Adult Health

## 2016-11-21 DIAGNOSIS — D62 Acute posthemorrhagic anemia: Secondary | ICD-10-CM | POA: Diagnosis not present

## 2016-11-21 DIAGNOSIS — F0150 Vascular dementia without behavioral disturbance: Secondary | ICD-10-CM | POA: Diagnosis not present

## 2016-11-21 DIAGNOSIS — E876 Hypokalemia: Secondary | ICD-10-CM

## 2016-11-21 DIAGNOSIS — K5901 Slow transit constipation: Secondary | ICD-10-CM

## 2016-11-21 DIAGNOSIS — I10 Essential (primary) hypertension: Secondary | ICD-10-CM | POA: Diagnosis not present

## 2016-11-21 DIAGNOSIS — Z8673 Personal history of transient ischemic attack (TIA), and cerebral infarction without residual deficits: Secondary | ICD-10-CM | POA: Diagnosis not present

## 2016-11-21 DIAGNOSIS — I480 Paroxysmal atrial fibrillation: Secondary | ICD-10-CM | POA: Diagnosis not present

## 2016-11-21 DIAGNOSIS — R2681 Unsteadiness on feet: Secondary | ICD-10-CM | POA: Diagnosis not present

## 2016-11-21 DIAGNOSIS — S72001S Fracture of unspecified part of neck of right femur, sequela: Secondary | ICD-10-CM

## 2016-11-21 DIAGNOSIS — G4700 Insomnia, unspecified: Secondary | ICD-10-CM

## 2016-11-21 NOTE — Progress Notes (Signed)
DATE:  11/21/2016   MRN:  [id number]  BIRTHDAY: [birth year]  Facility:  Nursing Home Location:  [facility] Room Number: 497-W  LEVEL OF CARE:  SNF (31)  Contact Information    Name Relation Home Work [place]  [phone]         Code Status History    Date Active Date Inactive Code Status Order ID Comments User Context   11/19/2016 12:29 PM 11/20/2016  7:22 PM DNR [id number]  Kerney Elbe, DO Inpatient   11/18/2016  6:13 PM 11/18/2016  6:13 PM Full Code [id number]  Kerney Elbe, DO Inpatient   11/18/2016  6:13 PM 11/19/2016 12:29 PM Full Code [id number]  Mcarthur Rossetti, MD Inpatient    Questions for Most Recent Historical Code Status (Order [id number])    Question Answer Comment   In the event of cardiac or respiratory ARREST Do not call a "code blue"    In the event of cardiac or respiratory ARREST Do not perform Intubation, CPR, defibrillation or ACLS    In the event of cardiac or respiratory ARREST Use medication by any route, position, wound care, and other measures to relive pain and suffering. May use oxygen, suction and manual treatment of airway obstruction as needed for comfort.         Advance Directive Documentation     Most Recent Value  Type of Advance Directive  Out of facility DNR (pink MOST or yellow form)  Pre-existing out of facility DNR order (yellow form or pink MOST form)  -  "MOST" Form in Place?  -       Chief Complaint  Patient presents with  . Hospitalization Follow-up    HISTORY OF PRESENT ILLNESS:  This is an 83-YO male seen for hospital follow-up.  He was admitted to [facility] on 11/20/2016 following an admission at [facility] 11/18/2016-11/20/2016 for an unwitnessed fall in which he sustained a right hip fracture and required an anterior hip arthroplasty on 11/18/16. He was seen in the room today with wife @ bedside. Right anterior hip surgical site  aquacel dressing  noted to be saturated with blood with small amount of blood leaking thru. He has PMH of atrial fibrillation not on any anticoagulation because of prior hemorrhagic stroke, history of ischemic stroke, hypertension, hyperlipidemia and polymyalgia.    PAST MEDICAL HISTORY:  Past Medical History:  Diagnosis Date  . Atrial fibrillation ([place])   . Closed right hip fracture, initial encounter ([place]) 11/2016  . History of CVA (cerebrovascular accident)   . Hyperlipidemia   . Hypertension   . Hypokalemia   . PAF (paroxysmal atrial fibrillation) ([place])   . Polymyalgia ([place])    resolved  . Stroke ([place])   . Unwitnessed fall      CURRENT MEDICATIONS: Reviewed  Patient's Medications  New Prescriptions   No medications on file  Previous Medications   ACETAMINOPHEN (TYLENOL) 325 MG TABLET    Take 2 tablets (650 mg total) by mouth every 6 (six) hours as needed for mild pain (or Fever >/= 101).   ASPIRIN EC 325 MG EC TABLET    Take 1 tablet (325 mg total) by mouth daily with breakfast.   DOCUSATE SODIUM (COLACE) 100 MG CAPSULE    Take 1 capsule (100 mg total) by mouth 2 (two) times daily.   HYDROCODONE-ACETAMINOPHEN (NORCO/VICODIN) 5-325 MG TABLET    Take 1-2 tablets by mouth every 6 (six) hours  as needed for moderate pain.   POLYETHYLENE GLYCOL (MIRALAX / GLYCOLAX) PACKET    Take 17 g by mouth daily as needed for mild constipation.   TRAZODONE (DESYREL) 50 MG TABLET    Take 25 mg by mouth at bedtime. Take 1/2 tablet to = 25 mg qd  Modified Medications   No medications on file  Discontinued Medications   ALUM & MAG HYDROXIDE-SIMETH (MAALOX/MYLANTA) 200-200-20 MG/5ML SUSPENSION    Take 30 mLs by mouth every 4 (four) hours as needed for indigestion.   BISACODYL (DULCOLAX) 10 MG SUPPOSITORY    Place 1 suppository (10 mg total) rectally daily as needed for moderate constipation.   IBUPROFEN PO    Take 1 tablet by mouth as needed.     Allergies  Allergen Reactions  . Lipitor [Atorvastatin]      myalgias     REVIEW OF SYSTEMS:  Unable to obtain due to advanced dementia    PHYSICAL EXAMINATION  GENERAL APPEARANCE: Well nourished. In no acute distress. Normal body habitus SKIN:  Right anterior hip aquacel dressing noted to be saturated with bloody drainage with small amount of blood leaking thru HEAD: Normal in size and contour. No evidence of trauma EYES: Lids open and close normally. No blepharitis, entropion or ectropion. PERRL. Conjunctivae are clear and sclerae are white. Lenses are without opacity EARS: Pinnae are normal. Patient hears normal voice tunes of the examiner MOUTH and THROAT: Lips are without lesions. Oral mucosa is moist and without lesions. Tongue is normal in shape, size, and color and without lesions NECK: supple, trachea midline, no neck masses, no thyroid tenderness, no thyromegaly LYMPHATICS: no LAN in the neck, no supraclavicular LAN RESPIRATORY: breathing is even & unlabored, BS CTAB CARDIAC: RRR, no murmur,no extra heart sounds, no edema GI: abdomen soft, normal BS, no masses, no tenderness, no hepatomegaly, no splenomegaly EXTREMITIES:  Able to move 4 extremities, limited ROM on RLE due to surgery NEURO:    Slurred speech PSYCHIATRIC: Alert to self and disoriented to time and place. Affect and behavior are appropriate   LABS/RADIOLOGY: Labs reviewed: Basic Metabolic Panel:  Recent Labs  11/18/16 1230 11/19/16 0514 11/20/16 0525  NA 140 141 138  K 3.4* 4.0 3.7  CL 105 105 106  CO2 28 28 26   GLUCOSE 133* 138* 116*  BUN 13 11 11   CREATININE 0.89 0.98 0.75  CALCIUM 9.3 8.0* 7.8*  MG  --   --  1.7  PHOS  --   --  1.5*   Liver Function Tests:  Recent Labs  11/20/16 0525  AST 49*  ALT 14*  ALKPHOS 68  BILITOT 0.9  PROT 5.1*  ALBUMIN 2.7*   CBC:  Recent Labs  11/18/16 1230 11/19/16 0514 11/20/16 0525  WBC 8.5 8.3 7.7  NEUTROABS 7.4  --  5.8  HGB 12.6* 9.9* 9.1*  HCT 37.2* 29.1* 27.0*  MCV 87.5 90.7 90.6  PLT 168 157  137*    Dg Ankle Complete Right  Result Date: 11/18/2016 CLINICAL DATA:  83 year old male with history of trauma from a fall today complaining of right ankle pain. EXAM: RIGHT ANKLE - COMPLETE 3+ VIEW COMPARISON:  No priors. FINDINGS: Multiple views of the right ankle demonstrate no acute displaced fracture, subluxation, dislocation, or soft tissue abnormality. IMPRESSION: No acute radiographic abnormality of the right ankle. Electronically Signed   By: Vinnie Langton M.D.   On: 11/18/2016 12:05   Ct Head Wo Contrast  Result Date: 11/18/2016 CLINICAL DATA:  Fall  getting out of shower, right hip deformity EXAM: CT HEAD WITHOUT CONTRAST TECHNIQUE: Contiguous axial images were obtained from the base of the skull through the vertex without intravenous contrast. COMPARISON:  03/27/2010 FINDINGS: Brain: No intracranial hemorrhage, mass effect or midline shift. Moderate cerebral atrophy. There is periventricular an extensive patchy subcortical white matter decreased attenuation probable due to chronic small vessel ischemic changes. Small lacunar infarct in left basal ganglia. Stable encephalomalacia sequela from prior hemorrhage in left posterior parietal lobe and occipital lobe. No definite acute cortical infarction. No mass lesion is noted on this unenhanced scan. Vascular: Mild atherosclerotic calcifications of carotid siphon Skull: No skull fracture is noted. Sinuses/Orbits: No acute findings Other: None IMPRESSION: No acute intracranial abnormality. Stable cerebral atrophy. Moderate periventricular and patchy subcortical white matter decreased attenuation probable due to chronic small vessel ischemic changes. There is encephalomalacia sequela from prior hemorrhage in left posterior parietal and left occipital lobe. No definite acute cortical infarction. Electronically Signed   By: Lahoma Crocker M.D.   On: 11/18/2016 14:55   Pelvis Portable  Result Date: 11/18/2016 CLINICAL DATA:  Right hip arthroplasty.  EXAM: PORTABLE PELVIS 1-2 VIEWS COMPARISON:  None. FINDINGS: Single AP view pelvis demonstrates right hip arthroplasty, without acute hardware complication. Mild to moderate left hip osteoarthritis. Surgical staples superficial to the joint. IMPRESSION: Expected appearance after right hip arthroplasty. Electronically Signed   By: Abigail Miyamoto M.D.   On: 11/18/2016 18:50   Dg Chest Port 1 View  Result Date: 11/18/2016 CLINICAL DATA:  Preoperative examination prior to hip surgery. EXAM: PORTABLE CHEST 1 VIEW COMPARISON:  03/24/2010 FINDINGS: Cardiomegaly identified. There is no evidence of focal airspace disease, pulmonary edema, suspicious pulmonary nodule/mass, pleural effusion, or pneumothorax. No acute bony abnormalities are identified. IMPRESSION: Cardiomegaly without evidence of acute cardiopulmonary disease. Electronically Signed   By: Margarette Canada M.D.   On: 11/18/2016 13:10   Dg C-arm 1-60 Min-no Report  Result Date: 11/18/2016 Fluoroscopy was utilized by the requesting physician.  No radiographic interpretation.   Dg Hip Operative Unilat W Or W/o Pelvis Right  Result Date: 11/18/2016 CLINICAL DATA:  Right hip replacement common known right hip fracture EXAM: OPERATIVE RIGHT HIP WITH PELVIS COMPARISON:  11/18/2016 FLUOROSCOPY TIME:  Radiation Exposure Index (as provided by the fluoroscopic device): 0.72 mGy If the device does not provide the exposure index: Fluoroscopy Time:  10.6 seconds Number of Acquired Images:  4 FINDINGS: Right hip fracture is again seen on the initial images. Right hip prosthesis is subsequent shown in satisfactory position. No acute abnormality noted. IMPRESSION: Status post right hip replacement Electronically Signed   By: Inez Catalina M.D.   On: 11/18/2016 18:46   Dg Hip Unilat  With Pelvis 2-3 Views Right  Result Date: 11/18/2016 CLINICAL DATA:  83 year old male with history of trauma from a fall at home today complaining of pain in the right hip. EXAM: DG HIP (WITH  OR WITHOUT PELVIS) 2-3V RIGHT COMPARISON:  No priors. FINDINGS: Multiple views of the pelvis and right hip demonstrate a transcervical right femoral neck fracture which appears foreshortened and angulated approximately 45 degrees. Right femoral head remains located. Bony pelvis appears intact. Left proximal femur appears intact as visualized. IMPRESSION: 1. Acute right-sided angulated transcervical femoral neck fracture, as above. Electronically Signed   By: Vinnie Langton M.D.   On: 11/18/2016 12:04    ASSESSMENT/PLAN:  Unsteady gait - for rehabilitation, PT and OT, for therapeutic strengthening exercises; fall precautions  Closed right hip fracture - S/P  right hip hemiarthroplasty, for rehabilitation, PT and OT, for therapeutic strengthening exercises; RLE WBAT; continue aspirin EC 325 mg 1 tab by mouth daily X 30 days for DVT prophylaxis; Tylenol 325 mg 2 tabs = 650 mg by mouth every 6 hours when necessary and Norco 5/325 mg 1-2  tabs by mouth every 6 hours PRN for pain; physiatry consult; follow-up with orthopedics, Dr. Ninfa Linden, in 2 weeks; notified Dt. Ninfa Linden regarding aquacel saturation of blood and wound nurse was advised to remove dressing and noted no signs of infection, 27 staples intact and covered with calcium alginate with dry dressing  Constipation - continue MiraLAX 17 g by mouth daily when necessary and Colace 100 mg 1 capsule by mouth twice a day  Insomnia - continue trazodone 50 mg 1/2 tab = 25 mg by mouth daily at bedtime  Anemia, acute blood loss - post-op;  re-check CBC Lab Results  Component Value Date   HGB 9.1 (L) 11/20/2016   Vascular dementia without behavioral behavioral disturbance - continue supportive care; fall precautions  Essential hypertension - not on any medications; stable and will monitor  Hypokalemia - was supplemented in the hospital;  check CMP Lab Results  Component Value Date   K 3.7 11/20/2016   Hypophosphatemia - was repleted in the  hospital; check phos Lab Results  Component Value Date   CALCIUM 7.8 (L) 11/20/2016   PHOS 1.5 (L) 11/20/2016   Paroxysmal atrial fibrillation - rate controlled; not on anticoagulation given history of hemorrhagic CVA but currently on aspirin EC 325 mg 1 tab by mouth daily 30 days for DVT prophylaxis  History of CVA - head CT showed encephalomalacia from prior hemorrhage in the left posterior parietal and left occipital lobe; currently on aspirin 325 mg 1 tab by mouth daily for DVT prophylaxis    Goals of care:  Short-term rehabilitation    Orly Quimby C. [place] - NP    [employer] [phone]

## 2016-11-22 ENCOUNTER — Non-Acute Institutional Stay (SKILLED_NURSING_FACILITY): Payer: Medicare Other | Admitting: Internal Medicine

## 2016-11-22 DIAGNOSIS — F0391 Unspecified dementia with behavioral disturbance: Secondary | ICD-10-CM | POA: Diagnosis not present

## 2016-11-22 DIAGNOSIS — D62 Acute posthemorrhagic anemia: Secondary | ICD-10-CM | POA: Diagnosis not present

## 2016-11-22 DIAGNOSIS — W19XXXA Unspecified fall, initial encounter: Secondary | ICD-10-CM

## 2016-11-22 DIAGNOSIS — Y92129 Unspecified place in nursing home as the place of occurrence of the external cause: Secondary | ICD-10-CM

## 2016-11-22 DIAGNOSIS — E440 Moderate protein-calorie malnutrition: Secondary | ICD-10-CM | POA: Diagnosis not present

## 2016-11-22 DIAGNOSIS — R2681 Unsteadiness on feet: Secondary | ICD-10-CM

## 2016-11-22 DIAGNOSIS — K5900 Constipation, unspecified: Secondary | ICD-10-CM | POA: Diagnosis not present

## 2016-11-22 DIAGNOSIS — G4700 Insomnia, unspecified: Secondary | ICD-10-CM | POA: Diagnosis not present

## 2016-11-22 DIAGNOSIS — S72001S Fracture of unspecified part of neck of right femur, sequela: Secondary | ICD-10-CM

## 2016-11-22 NOTE — Progress Notes (Signed)
LOCATION: [facility]  PCP: Melinda Crutch, MD   Code Status: DNR  Goals of care: Advanced Directive information Advanced Directives 11/21/2016  Does Patient Have a Medical Advance Directive? Yes  Type of Advance Directive Out of facility DNR (pink MOST or yellow form)  Does patient want to make changes to medical advance directive? No - Patient declined  Copy of [place] in Chart? -  Would patient like information on creating a medical advance directive? No - Patient declined       Extended Emergency Contact Information Primary Emergency Contact: Boyington,ELAINE Address: [address]          [place],  [place] Home Phone: [phone] Relation: None   Allergies  Allergen Reactions  . Lipitor [Atorvastatin]     myalgias    Chief Complaint  Patient presents with  . New Admit To SNF    New Admission Visit      HPI:  Patient is a 83 y.o. male seen today for short term rehabilitation post hospital admission from 11/18/2016-11/20/2016 post fall with closed right hip fracture. He underwent right hip arthroplasty on 11/18/2016. He is seen in his room today. He has medical history of stroke, hyperlipidemia, peptic ulcer disease, dementia among others. Of note, per nursing he had a fall last night and hit his left side. No injury has been reported and neurochecks have been done. Per nursing, he has increased confusion in the night time.   Review of Systems: Limited with his dementia Constitutional: Negative for fever  HENT: Negative for headache  Respiratory: Negative for shortness of breath  Cardiovascular: Negative for chest pain, palpitation Gastrointestinal: Negative for heartburn, nausea, vomiting, abdominal pain Genitourinary: Negative for dysuria Musculoskeletal: positive for fall in the facility and is a high fall risk.  Skin: Negative for itching, rash.  Neurological: Negative for dizziness   Past Medical History:  Diagnosis Date  .  Atrial fibrillation ([place])   . Closed right hip fracture, initial encounter ([place] [facility]) 11/2016  . History of CVA (cerebrovascular accident)   . Hyperlipidemia   . Hypertension   . Hypokalemia   . PAF (paroxysmal atrial fibrillation) ([place])   . Polymyalgia ([place])    resolved  . Stroke ([place])   . Unwitnessed fall    Past Surgical History:  Procedure Laterality Date  . HEMORRHOID SURGERY    . TOTAL HIP ARTHROPLASTY Right 11/18/2016   Procedure: BIPOLAR HIP ARTHROPLASTY ANTERIOR APPROACH;  Surgeon: Mcarthur Rossetti, MD;  Location: [facility];  Service: Orthopedics;  Laterality: Right;  . TOTAL KNEE ARTHROPLASTY Right 2011   Social History:   reports that he quit smoking about 7 years ago. His smoking use included Cigarettes. He has never used smokeless tobacco. He reports that he drinks alcohol. He reports that he does not use drugs.  Family History  Problem Relation Age of Onset  . Heart disease Mother   . Aneurysm Brother     Medications: Allergies as of 11/22/2016      Reactions   Lipitor [atorvastatin]    myalgias      Medication List       Accurate as of 11/22/16 10:22 AM. Always use your most recent med list.          acetaminophen 325 MG tablet Commonly known as:  TYLENOL Take 2 tablets (650 mg total) by mouth every 6 (six) hours as needed for mild pain (or Fever >/= 101).   aspirin 325 MG EC tablet Take 1  tablet (325 mg total) by mouth daily with breakfast.   docusate sodium 100 MG capsule Commonly known as:  COLACE Take 1 capsule (100 mg total) by mouth 2 (two) times daily.   HYDROcodone-acetaminophen 5-325 MG tablet Commonly known as:  NORCO/VICODIN Take 1-2 tablets by mouth every 6 (six) hours as needed for moderate pain.   polyethylene glycol packet Commonly known as:  MIRALAX / GLYCOLAX Take 17 g by mouth daily as needed for mild constipation.   traZODone 50 MG tablet Commonly known as:  DESYREL Take 25 mg by mouth at bedtime. Take 1/2 tablet to = 25 mg  qhs       Immunizations: Immunization History  Administered Date(s) Administered  . PPD Test 11/20/2016     Physical Exam: Vitals:   11/22/16 1006  BP: (!) 135/98  Resp: 18  Temp: 98.5 F (36.9 C)  TempSrc: Oral  SpO2: 96%  Weight: 145 lb 8 oz (66 kg)  Height: 5\' 7"  (1.702 m)   Body mass index is 22.79 kg/m.  General- elderly male, frail and thin built, in no acute distress Head- normocephalic, atraumatic Nose- no maxillary or frontal sinus tenderness, no nasal discharge Throat- moist mucus membrane, edentulous  Eyes- PERRLA, EOMI, no pallor, no icterus, no discharge, normal conjunctiva, normal sclera Neck- no cervical lymphadenopathy Cardiovascular- normal s1,s2, no murmur Respiratory- bilateral clear to auscultation, no wheeze, no rhonchi, no crackles, no use of accessory muscles Abdomen- bowel sounds present, soft, non tender Musculoskeletal- able to move all 4 extremities, limited right hip range of motion, good left hip range of moion Neurological- alert and oriented to self Skin- warm and dry, right hip surgical incision healing well with some serous drainage, soft tissue swelling around the surgical incision site that is nontender to touch and with normal temperature, extensive bruise around the right hip area Psychiatry- pleasantly confused    Labs reviewed: Basic Metabolic Panel:  Recent Labs  11/18/16 1230 11/19/16 0514 11/20/16 0525  NA 140 141 138  K 3.4* 4.0 3.7  CL 105 105 106  CO2 28 28 26   GLUCOSE 133* 138* 116*  BUN 13 11 11   CREATININE 0.89 0.98 0.75  CALCIUM 9.3 8.0* 7.8*  MG  --   --  1.7  PHOS  --   --  1.5*   Liver Function Tests:  Recent Labs  11/20/16 0525  AST 49*  ALT 14*  ALKPHOS 68  BILITOT 0.9  PROT 5.1*  ALBUMIN 2.7*   No results for input(s): LIPASE, AMYLASE in the last 8760 hours. No results for input(s): AMMONIA in the last 8760 hours. CBC:  Recent Labs  11/18/16 1230 11/19/16 0514 11/20/16 0525  WBC  8.5 8.3 7.7  NEUTROABS 7.4  --  5.8  HGB 12.6* 9.9* 9.1*  HCT 37.2* 29.1* 27.0*  MCV 87.5 90.7 90.6  PLT 168 157 137*   Cardiac Enzymes: No results for input(s): CKTOTAL, CKMB, CKMBINDEX, TROPONINI in the last 8760 hours. BNP: Invalid input(s): POCBNP CBG: No results for input(s): GLUCAP in the last 8760 hours.  Radiological Exams: Dg Ankle Complete Right  Result Date: 11/18/2016 CLINICAL DATA:  83 year old male with history of trauma from a fall today complaining of right ankle pain. EXAM: RIGHT ANKLE - COMPLETE 3+ VIEW COMPARISON:  No priors. FINDINGS: Multiple views of the right ankle demonstrate no acute displaced fracture, subluxation, dislocation, or soft tissue abnormality. IMPRESSION: No acute radiographic abnormality of the right ankle. Electronically Signed   By: Mauri Brooklyn.D.  On: 11/18/2016 12:05   Ct Head Wo Contrast  Result Date: 11/18/2016 CLINICAL DATA:  Fall getting out of shower, right hip deformity EXAM: CT HEAD WITHOUT CONTRAST TECHNIQUE: Contiguous axial images were obtained from the base of the skull through the vertex without intravenous contrast. COMPARISON:  03/27/2010 FINDINGS: Brain: No intracranial hemorrhage, mass effect or midline shift. Moderate cerebral atrophy. There is periventricular an extensive patchy subcortical white matter decreased attenuation probable due to chronic small vessel ischemic changes. Small lacunar infarct in left basal ganglia. Stable encephalomalacia sequela from prior hemorrhage in left posterior parietal lobe and occipital lobe. No definite acute cortical infarction. No mass lesion is noted on this unenhanced scan. Vascular: Mild atherosclerotic calcifications of carotid siphon Skull: No skull fracture is noted. Sinuses/Orbits: No acute findings Other: None IMPRESSION: No acute intracranial abnormality. Stable cerebral atrophy. Moderate periventricular and patchy subcortical white matter decreased attenuation probable due to  chronic small vessel ischemic changes. There is encephalomalacia sequela from prior hemorrhage in left posterior parietal and left occipital lobe. No definite acute cortical infarction. Electronically Signed   By: Lahoma Crocker M.D.   On: 11/18/2016 14:55   Pelvis Portable  Result Date: 11/18/2016 CLINICAL DATA:  Right hip arthroplasty. EXAM: PORTABLE PELVIS 1-2 VIEWS COMPARISON:  None. FINDINGS: Single AP view pelvis demonstrates right hip arthroplasty, without acute hardware complication. Mild to moderate left hip osteoarthritis. Surgical staples superficial to the joint. IMPRESSION: Expected appearance after right hip arthroplasty. Electronically Signed   By: Abigail Miyamoto M.D.   On: 11/18/2016 18:50   Dg Chest Port 1 View  Result Date: 11/18/2016 CLINICAL DATA:  Preoperative examination prior to hip surgery. EXAM: PORTABLE CHEST 1 VIEW COMPARISON:  03/24/2010 FINDINGS: Cardiomegaly identified. There is no evidence of focal airspace disease, pulmonary edema, suspicious pulmonary nodule/mass, pleural effusion, or pneumothorax. No acute bony abnormalities are identified. IMPRESSION: Cardiomegaly without evidence of acute cardiopulmonary disease. Electronically Signed   By: Margarette Canada M.D.   On: 11/18/2016 13:10   Dg C-arm 1-60 Min-no Report  Result Date: 11/18/2016 Fluoroscopy was utilized by the requesting physician.  No radiographic interpretation.   Dg Hip Operative Unilat W Or W/o Pelvis Right  Result Date: 11/18/2016 CLINICAL DATA:  Right hip replacement common known right hip fracture EXAM: OPERATIVE RIGHT HIP WITH PELVIS COMPARISON:  11/18/2016 FLUOROSCOPY TIME:  Radiation Exposure Index (as provided by the fluoroscopic device): 0.72 mGy If the device does not provide the exposure index: Fluoroscopy Time:  10.6 seconds Number of Acquired Images:  4 FINDINGS: Right hip fracture is again seen on the initial images. Right hip prosthesis is subsequent shown in satisfactory position. No acute  abnormality noted. IMPRESSION: Status post right hip replacement Electronically Signed   By: Inez Catalina M.D.   On: 11/18/2016 18:46   Dg Hip Unilat  With Pelvis 2-3 Views Right  Result Date: 11/18/2016 CLINICAL DATA:  83 year old male with history of trauma from a fall at home today complaining of pain in the right hip. EXAM: DG HIP (WITH OR WITHOUT PELVIS) 2-3V RIGHT COMPARISON:  No priors. FINDINGS: Multiple views of the pelvis and right hip demonstrate a transcervical right femoral neck fracture which appears foreshortened and angulated approximately 45 degrees. Right femoral head remains located. Bony pelvis appears intact. Left proximal femur appears intact as visualized. IMPRESSION: 1. Acute right-sided angulated transcervical femoral neck fracture, as above. Electronically Signed   By: Vinnie Langton M.D.   On: 11/18/2016 12:04    Assessment/Plan  Unsteady gait With right  hip fracture. Will need to work with physical therapy and occupational therapy to help regain his strength and balance. He remains a high fall risk.  Right hip fracture Status post arthroplasty on 11/18/2016. Will need follow-up with orthopedic. Continue aspirin enteric-coated 325 mg daily for one month for DVT prophylaxis. Continue Tylenol 650 mg every 6 hours as needed for pain and Norco 5-3 25 mg 1-2 tab every 6 hours as needed for pain. PMR to follow.  Protein calorie malnutrition RD consult, monitor po intake, on nutritional supplement, weekly weight check  Acute blood loss anemia Postop, monitor CBC  s/p fall Unwitnessed fall. Get xray of right hip to rule out fracture.   Dementia with behavioral disturbance Supportive care. Start seroquel 12.5 mg qhs for agitation. Monitor clinically for signs of infection. Get psych referral  Constipation Currently on Colace 100 mg twice a day and MiraLAX on a daily basis as needed, monitor bowel movement  Insomnia Currently on trazodone 25 mg every night, monitor  his sleep cycle    Goals of care: short term rehabilitation   Labs/tests ordered: cbc, bmp  Family/ staff Communication: reviewed care plan with patient and nursing supervisor     Blanchie Serve, MD Internal Medicine [facility] [place], [place] [address] Cell Phone (Monday-Friday 8 am - 5 pm): [phone] On Call: [phone] and follow prompts after 5 pm and on weekends Office Phone: [phone] Office Fax: [phone]

## 2016-11-25 ENCOUNTER — Emergency Department (HOSPITAL_COMMUNITY): Payer: Medicare Other

## 2016-11-25 ENCOUNTER — Inpatient Hospital Stay (HOSPITAL_COMMUNITY)
Admission: EM | Admit: 2016-11-25 | Discharge: 2016-12-05 | DRG: 871 | Disposition: E | Payer: Medicare Other | Attending: Internal Medicine | Admitting: Internal Medicine

## 2016-11-25 DIAGNOSIS — Z888 Allergy status to other drugs, medicaments and biological substances status: Secondary | ICD-10-CM

## 2016-11-25 DIAGNOSIS — A419 Sepsis, unspecified organism: Secondary | ICD-10-CM | POA: Diagnosis not present

## 2016-11-25 DIAGNOSIS — R52 Pain, unspecified: Secondary | ICD-10-CM

## 2016-11-25 DIAGNOSIS — F0390 Unspecified dementia without behavioral disturbance: Secondary | ICD-10-CM | POA: Diagnosis present

## 2016-11-25 DIAGNOSIS — F05 Delirium due to known physiological condition: Secondary | ICD-10-CM | POA: Diagnosis present

## 2016-11-25 DIAGNOSIS — Z7982 Long term (current) use of aspirin: Secondary | ICD-10-CM

## 2016-11-25 DIAGNOSIS — K5730 Diverticulosis of large intestine without perforation or abscess without bleeding: Secondary | ICD-10-CM | POA: Diagnosis present

## 2016-11-25 DIAGNOSIS — I480 Paroxysmal atrial fibrillation: Secondary | ICD-10-CM | POA: Diagnosis present

## 2016-11-25 DIAGNOSIS — I6350 Cerebral infarction due to unspecified occlusion or stenosis of unspecified cerebral artery: Secondary | ICD-10-CM | POA: Diagnosis present

## 2016-11-25 DIAGNOSIS — Z8673 Personal history of transient ischemic attack (TIA), and cerebral infarction without residual deficits: Secondary | ICD-10-CM

## 2016-11-25 DIAGNOSIS — J690 Pneumonitis due to inhalation of food and vomit: Secondary | ICD-10-CM | POA: Diagnosis present

## 2016-11-25 DIAGNOSIS — R4182 Altered mental status, unspecified: Secondary | ICD-10-CM | POA: Diagnosis not present

## 2016-11-25 DIAGNOSIS — I469 Cardiac arrest, cause unspecified: Secondary | ICD-10-CM

## 2016-11-25 DIAGNOSIS — Z96641 Presence of right artificial hip joint: Secondary | ICD-10-CM | POA: Diagnosis present

## 2016-11-25 DIAGNOSIS — E876 Hypokalemia: Secondary | ICD-10-CM | POA: Diagnosis present

## 2016-11-25 DIAGNOSIS — I959 Hypotension, unspecified: Secondary | ICD-10-CM | POA: Diagnosis present

## 2016-11-25 DIAGNOSIS — Z87891 Personal history of nicotine dependence: Secondary | ICD-10-CM

## 2016-11-25 DIAGNOSIS — Z96651 Presence of right artificial knee joint: Secondary | ICD-10-CM | POA: Diagnosis present

## 2016-11-25 DIAGNOSIS — I10 Essential (primary) hypertension: Secondary | ICD-10-CM | POA: Diagnosis present

## 2016-11-25 DIAGNOSIS — G9341 Metabolic encephalopathy: Secondary | ICD-10-CM

## 2016-11-25 DIAGNOSIS — E785 Hyperlipidemia, unspecified: Secondary | ICD-10-CM | POA: Diagnosis present

## 2016-11-25 DIAGNOSIS — Z66 Do not resuscitate: Secondary | ICD-10-CM | POA: Diagnosis present

## 2016-11-25 DIAGNOSIS — R471 Dysarthria and anarthria: Secondary | ICD-10-CM

## 2016-11-25 DIAGNOSIS — K5641 Fecal impaction: Secondary | ICD-10-CM | POA: Diagnosis present

## 2016-11-25 DIAGNOSIS — E860 Dehydration: Secondary | ICD-10-CM | POA: Diagnosis present

## 2016-11-25 DIAGNOSIS — D649 Anemia, unspecified: Secondary | ICD-10-CM

## 2016-11-25 DIAGNOSIS — R109 Unspecified abdominal pain: Secondary | ICD-10-CM

## 2016-11-25 DIAGNOSIS — M353 Polymyalgia rheumatica: Secondary | ICD-10-CM | POA: Diagnosis present

## 2016-11-25 DIAGNOSIS — I714 Abdominal aortic aneurysm, without rupture: Secondary | ICD-10-CM | POA: Diagnosis present

## 2016-11-25 DIAGNOSIS — Z8249 Family history of ischemic heart disease and other diseases of the circulatory system: Secondary | ICD-10-CM

## 2016-11-25 LAB — CBC WITH DIFFERENTIAL/PLATELET
BASOS ABS: 0 10*3/uL (ref 0.0–0.1)
BASOS PCT: 0 %
EOS PCT: 0 %
Eosinophils Absolute: 0 10*3/uL (ref 0.0–0.7)
HCT: 27.1 % — ABNORMAL LOW (ref 39.0–52.0)
Hemoglobin: 8.8 g/dL — ABNORMAL LOW (ref 13.0–17.0)
LYMPHS PCT: 9 %
Lymphs Abs: 1.3 10*3/uL (ref 0.7–4.0)
MCH: 30.1 pg (ref 26.0–34.0)
MCHC: 32.5 g/dL (ref 30.0–36.0)
MCV: 92.8 fL (ref 78.0–100.0)
Monocytes Absolute: 0.7 10*3/uL (ref 0.1–1.0)
Monocytes Relative: 5 %
Neutro Abs: 12.6 10*3/uL — ABNORMAL HIGH (ref 1.7–7.7)
Neutrophils Relative %: 86 %
PLATELETS: 294 10*3/uL (ref 150–400)
RBC: 2.92 MIL/uL — AB (ref 4.22–5.81)
RDW: 15.4 % (ref 11.5–15.5)
WBC: 14.5 10*3/uL — AB (ref 4.0–10.5)

## 2016-11-25 LAB — COMPREHENSIVE METABOLIC PANEL
ALK PHOS: 116 U/L (ref 38–126)
ALT: 41 U/L (ref 17–63)
AST: 75 U/L — ABNORMAL HIGH (ref 15–41)
Albumin: 3.3 g/dL — ABNORMAL LOW (ref 3.5–5.0)
Anion gap: 14 (ref 5–15)
BUN: 28 mg/dL — ABNORMAL HIGH (ref 6–20)
CALCIUM: 8.9 mg/dL (ref 8.9–10.3)
CO2: 25 mmol/L (ref 22–32)
Chloride: 108 mmol/L (ref 101–111)
Creatinine, Ser: 1.17 mg/dL (ref 0.61–1.24)
GFR calc non Af Amer: 54 mL/min — ABNORMAL LOW (ref >60)
Glucose, Bld: 152 mg/dL — ABNORMAL HIGH (ref 65–99)
Potassium: 3.2 mmol/L — ABNORMAL LOW (ref 3.5–5.1)
SODIUM: 147 mmol/L — AB (ref 135–145)
Total Bilirubin: 1.3 mg/dL — ABNORMAL HIGH (ref 0.3–1.2)
Total Protein: 6.1 g/dL — ABNORMAL LOW (ref 6.5–8.1)

## 2016-11-25 LAB — URINALYSIS, ROUTINE W REFLEX MICROSCOPIC
BACTERIA UA: NONE SEEN
Bilirubin Urine: NEGATIVE
GLUCOSE, UA: NEGATIVE mg/dL
Ketones, ur: NEGATIVE mg/dL
LEUKOCYTES UA: NEGATIVE
NITRITE: NEGATIVE
PH: 5 (ref 5.0–8.0)
Protein, ur: 100 mg/dL — AB
SPECIFIC GRAVITY, URINE: 1.021 (ref 1.005–1.030)
Squamous Epithelial / LPF: NONE SEEN

## 2016-11-25 LAB — PROTIME-INR
INR: 1.4
PROTHROMBIN TIME: 17.2 s — AB (ref 11.4–15.2)

## 2016-11-25 LAB — I-STAT CG4 LACTIC ACID, ED: Lactic Acid, Venous: 4.03 mmol/L (ref 0.5–1.9)

## 2016-11-25 MED ORDER — VANCOMYCIN HCL 10 G IV SOLR
1500.0000 mg | Freq: Once | INTRAVENOUS | Status: AC
  Administered 2016-11-25: 1500 mg via INTRAVENOUS
  Filled 2016-11-25: qty 1500

## 2016-11-25 MED ORDER — SODIUM CHLORIDE 0.9 % IV BOLUS (SEPSIS)
30.0000 mL/kg | Freq: Once | INTRAVENOUS | Status: AC
  Administered 2016-11-25: 1980 mL via INTRAVENOUS

## 2016-11-25 MED ORDER — PIPERACILLIN-TAZOBACTAM IN DEX 2-0.25 GM/50ML IV SOLN: 2.2500 g | Freq: Once | INTRAVENOUS | Status: DC

## 2016-11-25 MED ORDER — PIPERACILLIN-TAZOBACTAM 3.375 G IVPB 30 MIN
3.3750 g | INTRAVENOUS | Status: AC
  Administered 2016-11-25: 3.375 g via INTRAVENOUS
  Filled 2016-11-25: qty 50

## 2016-11-25 NOTE — ED Notes (Signed)
ED Provider at bedside. 

## 2016-11-25 NOTE — ED Triage Notes (Signed)
Pt BIB EMS from [facility] for decreased responsiveness and hypotension. Per EMS, nursing home staff reports pt BP 68/40; pt BP 120/80 with EMS; 109/97 on EMS arrival. Pt had hip surgery x 1 week ago, and several falls out of bed. Rt leg cool and bruised; pedal pulse marked.  Pt responds to voice, garbled speech; per fam pt has slurred speech at baseline. LSN unknown. Resp e/u.

## 2016-11-25 NOTE — ED Provider Notes (Signed)
[facility] DEPT Provider Note   CSN: [id number] Arrival date & time: 11/07/2016  2029     History   Chief Complaint Chief Complaint  Patient presents with  . Altered Mental Status  . Hypotension    HPI Frank Horn is a 83 y.o. male.  The history is provided by the EMS personnel, a relative and medical records.  Altered Mental Status   This is a new problem. Episode onset: Today. The problem has not changed since onset.Associated symptoms include confusion. Associated symptoms comments: Difficult to understand. Risk factors: recent surgery, at rehab facility. Past medical history comments: PAfib, HTN, prior CVA w/residual slurred speech.    Past Medical History:  Diagnosis Date  . Atrial fibrillation ([place])   . Closed right hip fracture, initial encounter ([place]) 11/2016  . History of CVA (cerebrovascular accident)   . Hyperlipidemia   . Hypertension   . Hypokalemia   . PAF (paroxysmal atrial fibrillation) ([place])   . Polymyalgia ([place])    resolved  . Stroke ([place])   . Unwitnessed fall     Patient Active Problem List   Diagnosis Date Noted  . Hypokalemia 11/18/2016  . Unwitnessed fall 11/18/2016  . AF (paroxysmal atrial fibrillation) ([place]) 11/18/2016  . Hyperlipidemia 11/18/2016  . History of CVA (cerebrovascular accident) 11/18/2016  . Closed right hip fracture, initial encounter ([place])   . Atherosclerosis of lower extremity with intermittent claudication ([place]) 06/26/2014  . 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 Essential hypertension 11/30/2007  . STROKE 11/30/2007  . TRANSIENT ISCHEMIC ATTACK 11/30/2007  . DIVERTICULOSIS, COLON 11/30/2007  . OSTEOARTHRITIS 11/30/2007  . PEPTIC ULCER DISEASE, HX OF 11/30/2007  . NEPHROLITHIASIS, HX OF 11/30/2007  . TONSILLECTOMY, HX OF 11/30/2007  . HEMORRHOIDECTOMY, HX OF 11/30/2007    Past Surgical History:  Procedure Laterality Date  . HEMORRHOID SURGERY    . TOTAL HIP ARTHROPLASTY Right 11/18/2016   Procedure:  BIPOLAR HIP ARTHROPLASTY ANTERIOR APPROACH;  Surgeon: Mcarthur Rossetti, MD;  Location: [facility];  Service: Orthopedics;  Laterality: Right;  . TOTAL KNEE ARTHROPLASTY Right 2011       Home Medications    Prior to Admission medications   Medication Sig Start Date End Date Taking? Authorizing Provider  acetaminophen (TYLENOL) 325 MG tablet Take 2 tablets (650 mg total) by mouth every 6 (six) hours as needed for mild pain (or Fever >/= 101). 11/20/16  Yes Aurelia, DO  aspirin EC 325 MG EC tablet Take 1 tablet (325 mg total) by mouth daily with breakfast. 11/20/16  Yes Mcarthur Rossetti, MD  docusate sodium (COLACE) 100 MG capsule Take 1 capsule (100 mg total) by mouth 2 (two) times daily. 11/20/16  Yes McIntosh, DO  HYDROcodone-acetaminophen (NORCO/VICODIN) 5-325 MG tablet Take 1-2 tablets by mouth every 6 (six) hours as needed for moderate pain. 11/20/16  Yes Paulden, DO  polyethylene glycol (MIRALAX / GLYCOLAX) packet Take 17 g by mouth daily as needed for mild constipation. 11/20/16  Yes Hinton, DO  QUEtiapine (SEROQUEL) 25 MG tablet Take 12.5 mg by mouth at bedtime.   Yes Historical Provider, MD  traZODone (DESYREL) 50 MG tablet Take 50 mg by mouth at bedtime. Take 1/2 tablet to = 25 mg qhs   Yes Historical Provider, MD    Family History Family History  Problem Relation Age of Onset  . Heart disease Mother   . Aneurysm Brother     Social History Social History  Substance Use Topics  .  Smoking status: Former Smoker    Types: Cigarettes    Quit date: 06/26/2009  . Smokeless tobacco: Never Used  . Alcohol use 0.0 oz/week     Allergies   Lipitor [atorvastatin]   Review of Systems Review of Systems  Unable to perform ROS: Mental status change  Psychiatric/Behavioral: Positive for confusion.   Physical Exam Updated Vital Signs BP 116/86   Pulse (!) 39   Temp 98.7 F (37.1 C) (Rectal)   Resp (!) 21   Physical Exam    Constitutional: He appears well-developed and well-nourished.  HENT:  Head: Normocephalic.  Bruising over left forehead from fall earlier this week, no underlying crepitus  Eyes: Conjunctivae are normal. Pupils are equal, round, and reactive to light.  Neck: Neck supple.  Cardiovascular: Normal rate and intact distal pulses.   No murmur heard. HR in 80s on exam  Pulmonary/Chest: Effort normal and breath sounds normal. No respiratory distress.  Abdominal: Soft. There is no tenderness.  Musculoskeletal: He exhibits no edema.  Well-healing incision w/staples in place over right hip w/CDI bandage; no drainage, erythema, or streaking to suggest overlying infection  Neurological: He is alert.  Dysarthric  Skin: Skin is warm and dry.  Psychiatric: He has a normal mood and affect.  Nursing note and vitals reviewed.   ED Treatments / Results  Labs (all labs ordered are listed, but only abnormal results are displayed) Labs Reviewed  COMPREHENSIVE METABOLIC PANEL - Abnormal; Notable for the following:       Result Value   Sodium 147 (*)    Potassium 3.2 (*)    Glucose, Bld 152 (*)    BUN 28 (*)    Total Protein 6.1 (*)    Albumin 3.3 (*)    AST 75 (*)    Total Bilirubin 1.3 (*)    GFR calc non Af Amer 54 (*)    All other components within normal limits  CBC WITH DIFFERENTIAL/PLATELET - Abnormal; Notable for the following:    WBC 14.5 (*)    RBC 2.92 (*)    Hemoglobin 8.8 (*)    HCT 27.1 (*)    Neutro Abs 12.6 (*)    All other components within normal limits  PROTIME-INR - Abnormal; Notable for the following:    Prothrombin Time 17.2 (*)    All other components within normal limits  URINALYSIS, ROUTINE W REFLEX MICROSCOPIC - Abnormal; Notable for the following:    Color, Urine AMBER (*)    APPearance HAZY (*)    Hgb urine dipstick SMALL (*)    Protein, ur 100 (*)    All other components within normal limits  I-STAT CG4 LACTIC ACID, ED - Abnormal; Notable for the following:     Lactic Acid, Venous 4.03 (*)    All other components within normal limits  CULTURE, BLOOD (ROUTINE X 2)  CULTURE, BLOOD (ROUTINE X 2)  URINE CULTURE  VITAMIN B12  FOLATE  IRON AND TIBC  FERRITIN  RETICULOCYTES  I-STAT CG4 LACTIC ACID, ED  TYPE AND SCREEN    EKG  EKG Interpretation None       Radiology Dg Chest 1 View  Result Date: 11/28/2016 CLINICAL DATA:  Altered mental status EXAM: CHEST 1 VIEW COMPARISON:  11/18/2016, 03/24/2010 FINDINGS: Cardiomegaly. Unfolded aorta. No focal consolidation or effusion. No pneumothorax. IMPRESSION: Cardiomegaly.  No infiltrate or edema. Electronically Signed   By: Donavan Foil M.D.   On: 11/10/2016 22:48   Ct Head Wo Contrast  Result Date:  11/18/2016 CLINICAL DATA:  Status post fall, with acute onset of altered mental status. Initial encounter. EXAM: CT HEAD WITHOUT CONTRAST TECHNIQUE: Contiguous axial images were obtained from the base of the skull through the vertex without intravenous contrast. COMPARISON:  CT of the head performed 11/18/2016, and MRI of the brain performed 05/23/2010 FINDINGS: Brain: No evidence of acute infarction, hemorrhage, hydrocephalus, extra-axial collection or mass lesion/mass effect. Prominence of the ventricles and sulci reflects mild to moderate cortical volume loss. Mild cerebellar atrophy is noted. Scattered periventricular and subcortical white matter change likely reflects small vessel ischemic microangiopathy. Chronic encephalomalacia is noted at the left occipital lobe, reflecting remote infarct. The brainstem and fourth ventricle are within normal limits. The basal ganglia are unremarkable in appearance. No mass effect or midline shift is seen. Vascular: No hyperdense vessel or unexpected calcification. Skull: There is no evidence of fracture; visualized osseous structures are unremarkable in appearance. Sinuses/Orbits: The orbits are within normal limits. The paranasal sinuses and mastoid air cells are  well-aerated. Other: No significant soft tissue abnormalities are seen. IMPRESSION: 1. No evidence of traumatic intracranial injury or fracture. 2. Mild to moderate cortical volume loss and scattered small vessel ischemic microangiopathy. 3. Chronic encephalomalacia at the left occipital lobe, reflecting remote infarct. Electronically Signed   By: Garald Balding M.D.   On: 12/01/2016 22:21    Procedures Procedures (including critical care time)  Medications Ordered in ED Medications  vancomycin (VANCOCIN) 1,500 mg in sodium chloride 0.9 % 500 mL IVPB (1,500 mg Intravenous New Bag/Given 11/28/2016 2247)  sodium chloride 0.9 % bolus 1,980 mL (0 mL/kg  66 kg Intravenous Stopped 11/27/2016 2326)  piperacillin-tazobactam (ZOSYN) IVPB 3.375 g (0 g Intravenous Stopped 11/12/2016 2326)     Initial Impression / Assessment and Plan / ED Course  I have reviewed the triage vital signs and the nursing notes.  Pertinent labs & imaging results that were available during my care of the patient were reviewed by me and considered in my medical decision making (see chart for details).    Pt with h/o PAfib (not on A/C), CVA, HTN & right hip hemiarthroplasty on 4/14 presents with altered mental status. Family says the Pt hasn't been eating well since he arrived at rehab on Monday; they also note that he's fallen at least 3 times while there. Last night, to prevent another fall, the staff put him in a chair by the nurses station so he could be watched & the family says he didn't sleep at all. Today, the facility staff called 911 for HoTN & decreased responsiveness; when EMS arrived the Pt was normotensive, alert, & answering questions (the Pt's speech is slurred at baseline, but family says he's more difficult to understand than normal).  VS & exam as above. CODE SEPSIS initiated on presentation; 30cc/kg resuscitation, cultures collected. CT head w/NAICA. CXR WNL. Labs remarkable for WBC 14.5, Hgb 8.8, LA 4.03, BUN 28, Crt  1.17. UA w/o signs of infection. V&Z started empirically.  Will admit the Pt to the Hospitalist's service for further evaluation and treatment.  Final Clinical Impressions(s) / ED Diagnoses   Final diagnoses:  Dehydration  Anemia, unspecified type  Dysarthria    New Prescriptions New Prescriptions   No medications on file     Jenny Reichmann, MD 12-24-2016 [id number]    Elnora Morrison, MD 12-24-2016 [id number]    Elnora Morrison, MD 12/17/16 [phone]

## 2016-11-25 NOTE — ED Notes (Signed)
Patient transported to CT 

## 2016-11-25 NOTE — ED Notes (Signed)
Patient transported to X-ray 

## 2016-11-25 NOTE — ED Notes (Signed)
Unsuccessful blood draw,  Reject x1,  Will notify next phleb.

## 2016-11-26 ENCOUNTER — Inpatient Hospital Stay (HOSPITAL_COMMUNITY): Payer: Medicare Other

## 2016-11-26 DIAGNOSIS — D649 Anemia, unspecified: Secondary | ICD-10-CM | POA: Diagnosis present

## 2016-11-26 DIAGNOSIS — M353 Polymyalgia rheumatica: Secondary | ICD-10-CM | POA: Diagnosis present

## 2016-11-26 DIAGNOSIS — I469 Cardiac arrest, cause unspecified: Secondary | ICD-10-CM

## 2016-11-26 DIAGNOSIS — Z8673 Personal history of transient ischemic attack (TIA), and cerebral infarction without residual deficits: Secondary | ICD-10-CM | POA: Diagnosis not present

## 2016-11-26 DIAGNOSIS — F0390 Unspecified dementia without behavioral disturbance: Secondary | ICD-10-CM | POA: Diagnosis present

## 2016-11-26 DIAGNOSIS — E785 Hyperlipidemia, unspecified: Secondary | ICD-10-CM | POA: Diagnosis present

## 2016-11-26 DIAGNOSIS — R4182 Altered mental status, unspecified: Secondary | ICD-10-CM | POA: Diagnosis present

## 2016-11-26 DIAGNOSIS — I480 Paroxysmal atrial fibrillation: Secondary | ICD-10-CM | POA: Diagnosis present

## 2016-11-26 DIAGNOSIS — J690 Pneumonitis due to inhalation of food and vomit: Secondary | ICD-10-CM | POA: Diagnosis present

## 2016-11-26 DIAGNOSIS — Z96651 Presence of right artificial knee joint: Secondary | ICD-10-CM | POA: Diagnosis present

## 2016-11-26 DIAGNOSIS — A419 Sepsis, unspecified organism: Secondary | ICD-10-CM | POA: Diagnosis present

## 2016-11-26 DIAGNOSIS — E876 Hypokalemia: Secondary | ICD-10-CM | POA: Diagnosis present

## 2016-11-26 DIAGNOSIS — Z7982 Long term (current) use of aspirin: Secondary | ICD-10-CM | POA: Diagnosis not present

## 2016-11-26 DIAGNOSIS — K5641 Fecal impaction: Secondary | ICD-10-CM | POA: Diagnosis present

## 2016-11-26 DIAGNOSIS — Z87891 Personal history of nicotine dependence: Secondary | ICD-10-CM | POA: Diagnosis not present

## 2016-11-26 DIAGNOSIS — Z888 Allergy status to other drugs, medicaments and biological substances status: Secondary | ICD-10-CM | POA: Diagnosis not present

## 2016-11-26 DIAGNOSIS — I714 Abdominal aortic aneurysm, without rupture: Secondary | ICD-10-CM | POA: Diagnosis present

## 2016-11-26 DIAGNOSIS — K5730 Diverticulosis of large intestine without perforation or abscess without bleeding: Secondary | ICD-10-CM | POA: Diagnosis present

## 2016-11-26 DIAGNOSIS — F05 Delirium due to known physiological condition: Secondary | ICD-10-CM | POA: Diagnosis present

## 2016-11-26 DIAGNOSIS — E872 Acidosis: Secondary | ICD-10-CM

## 2016-11-26 DIAGNOSIS — G9341 Metabolic encephalopathy: Secondary | ICD-10-CM | POA: Diagnosis present

## 2016-11-26 DIAGNOSIS — E860 Dehydration: Secondary | ICD-10-CM | POA: Diagnosis present

## 2016-11-26 DIAGNOSIS — Z8249 Family history of ischemic heart disease and other diseases of the circulatory system: Secondary | ICD-10-CM | POA: Diagnosis not present

## 2016-11-26 DIAGNOSIS — I959 Hypotension, unspecified: Secondary | ICD-10-CM | POA: Diagnosis present

## 2016-11-26 DIAGNOSIS — Z96641 Presence of right artificial hip joint: Secondary | ICD-10-CM | POA: Diagnosis present

## 2016-11-26 DIAGNOSIS — Z66 Do not resuscitate: Secondary | ICD-10-CM | POA: Diagnosis present

## 2016-11-26 DIAGNOSIS — I10 Essential (primary) hypertension: Secondary | ICD-10-CM | POA: Diagnosis present

## 2016-11-26 LAB — LACTIC ACID, PLASMA
LACTIC ACID, VENOUS: 2 mmol/L — AB (ref 0.5–1.9)
Lactic Acid, Venous: 3.6 mmol/L (ref 0.5–1.9)

## 2016-11-26 LAB — BASIC METABOLIC PANEL
Anion gap: 11 (ref 5–15)
BUN: 28 mg/dL — ABNORMAL HIGH (ref 6–20)
CALCIUM: 7.7 mg/dL — AB (ref 8.9–10.3)
CHLORIDE: 112 mmol/L — AB (ref 101–111)
CO2: 21 mmol/L — ABNORMAL LOW (ref 22–32)
CREATININE: 1.12 mg/dL (ref 0.61–1.24)
GFR calc non Af Amer: 57 mL/min — ABNORMAL LOW (ref >60)
Glucose, Bld: 142 mg/dL — ABNORMAL HIGH (ref 65–99)
Potassium: 3.2 mmol/L — ABNORMAL LOW (ref 3.5–5.1)
SODIUM: 144 mmol/L (ref 135–145)

## 2016-11-26 LAB — CBC
HCT: 28.9 % — ABNORMAL LOW (ref 39.0–52.0)
HEMOGLOBIN: 9.1 g/dL — AB (ref 13.0–17.0)
MCH: 29.6 pg (ref 26.0–34.0)
MCHC: 31.5 g/dL (ref 30.0–36.0)
MCV: 94.1 fL (ref 78.0–100.0)
Platelets: 238 10*3/uL (ref 150–400)
RBC: 3.07 MIL/uL — ABNORMAL LOW (ref 4.22–5.81)
RDW: 15.5 % (ref 11.5–15.5)
WBC: 12.2 10*3/uL — ABNORMAL HIGH (ref 4.0–10.5)

## 2016-11-26 LAB — I-STAT CG4 LACTIC ACID, ED: LACTIC ACID, VENOUS: 3.39 mmol/L — AB (ref 0.5–1.9)

## 2016-11-26 LAB — RETICULOCYTES
RBC.: 2.91 MIL/uL — AB (ref 4.22–5.81)
RETIC COUNT ABSOLUTE: 119.3 10*3/uL (ref 19.0–186.0)
RETIC CT PCT: 4.1 % — AB (ref 0.4–3.1)

## 2016-11-26 LAB — FOLATE: Folate: 14.1 ng/mL (ref >5.9)

## 2016-11-26 LAB — PROCALCITONIN

## 2016-11-26 LAB — TYPE AND SCREEN
ABO/RH(D): O POS
ANTIBODY SCREEN: NEGATIVE

## 2016-11-26 LAB — IRON AND TIBC
Iron: 49 ug/dL (ref 45–182)
SATURATION RATIOS: 19 % (ref 17.9–39.5)
TIBC: 260 ug/dL (ref 250–450)
UIBC: 211 ug/dL

## 2016-11-26 LAB — MRSA PCR SCREENING: MRSA by PCR: NEGATIVE

## 2016-11-26 LAB — FERRITIN: FERRITIN: 314 ng/mL (ref 24–336)

## 2016-11-26 LAB — VITAMIN B12: Vitamin B-12: 301 pg/mL (ref 180–914)

## 2016-11-26 LAB — ABO/RH: ABO/RH(D): O POS

## 2016-11-26 MED ORDER — QUETIAPINE 12.5 MG HALF TABLET: 12.5000 mg | ORAL_TABLET | Freq: Every day | ORAL | Status: DC

## 2016-11-26 MED ORDER — HYDROMORPHONE HCL 1 MG/ML IJ SOLN
0.5000 mg | Freq: Once | INTRAMUSCULAR | Status: AC
  Administered 2016-11-26: 0.5 mg via INTRAVENOUS
  Filled 2016-11-26: qty 1

## 2016-11-26 MED ORDER — SENNA 8.6 MG PO TABS
2.0000 | ORAL_TABLET | Freq: Two times a day (BID) | ORAL | Status: DC
  Filled 2016-11-26: qty 2

## 2016-11-26 MED ORDER — ASPIRIN EC 325 MG PO TBEC
325.0000 mg | DELAYED_RELEASE_TABLET | Freq: Every day | ORAL | Status: DC
  Filled 2016-11-26: qty 1

## 2016-11-26 MED ORDER — POLYETHYLENE GLYCOL 3350 17 G PO PACK: 17.0000 g | PACK | Freq: Every day | ORAL | Status: DC | PRN

## 2016-11-26 MED ORDER — DOCUSATE SODIUM 100 MG PO CAPS
100.0000 mg | ORAL_CAPSULE | Freq: Two times a day (BID) | ORAL | Status: DC
  Filled 2016-11-26: qty 1

## 2016-11-26 MED ORDER — SODIUM CHLORIDE 0.9 % IV BOLUS (SEPSIS)
500.0000 mL | Freq: Once | INTRAVENOUS | Status: AC
  Administered 2016-11-26: 500 mL via INTRAVENOUS

## 2016-11-26 MED ORDER — LORAZEPAM 2 MG/ML IJ SOLN: 1.0000 mg | INTRAMUSCULAR | Status: DC | PRN

## 2016-11-26 MED ORDER — ONDANSETRON HCL 4 MG PO TABS: 4.0000 mg | ORAL_TABLET | Freq: Four times a day (QID) | ORAL | Status: DC | PRN

## 2016-11-26 MED ORDER — ACETAMINOPHEN 650 MG RE SUPP: 650.0000 mg | Freq: Four times a day (QID) | RECTAL | Status: DC | PRN

## 2016-11-26 MED ORDER — ACETAMINOPHEN 325 MG PO TABS: 650.0000 mg | ORAL_TABLET | Freq: Four times a day (QID) | ORAL | Status: DC | PRN

## 2016-11-26 MED ORDER — TRAZODONE HCL 50 MG PO TABS
50.0000 mg | ORAL_TABLET | Freq: Every day | ORAL | Status: DC
  Administered 2016-11-26: 50 mg via ORAL
  Filled 2016-11-26: qty 1

## 2016-11-26 MED ORDER — PIPERACILLIN-TAZOBACTAM 3.375 G IVPB
3.3750 g | Freq: Three times a day (TID) | INTRAVENOUS | Status: DC
  Administered 2016-11-26: 3.375 g via INTRAVENOUS
  Filled 2016-11-26 (×2): qty 50

## 2016-11-26 MED ORDER — SODIUM CHLORIDE 0.9 % IV SOLN
INTRAVENOUS | Status: DC
Start: 2016-11-26 — End: 2016-11-26
  Administered 2016-11-26: 100 mL/h via INTRAVENOUS

## 2016-11-26 MED ORDER — VANCOMYCIN HCL IN DEXTROSE 1-5 GM/200ML-% IV SOLN: 1000.0000 mg | INTRAVENOUS | Status: DC

## 2016-11-26 MED ORDER — HYDROCODONE-ACETAMINOPHEN 5-325 MG PO TABS: 1.0000 | ORAL_TABLET | Freq: Four times a day (QID) | ORAL | Status: DC | PRN

## 2016-11-26 MED ORDER — ONDANSETRON HCL 4 MG/2ML IJ SOLN: 4.0000 mg | Freq: Four times a day (QID) | INTRAMUSCULAR | Status: DC | PRN

## 2016-11-26 MED ORDER — FENTANYL CITRATE (PF) 100 MCG/2ML IJ SOLN
25.0000 ug | Freq: Once | INTRAMUSCULAR | Status: AC
  Administered 2016-11-26: 25 ug via INTRAVENOUS
  Filled 2016-11-26: qty 2

## 2016-11-26 MED ORDER — POTASSIUM CHLORIDE 20 MEQ/15ML (10%) PO SOLN
40.0000 meq | Freq: Once | ORAL | Status: DC
  Filled 2016-11-26: qty 30

## 2016-11-26 MED ORDER — IPRATROPIUM-ALBUTEROL 0.5-2.5 (3) MG/3ML IN SOLN: 3.0000 mL | RESPIRATORY_TRACT | Status: DC | PRN

## 2016-11-27 LAB — URINE CULTURE: CULTURE: NO GROWTH

## 2016-11-30 LAB — CULTURE, BLOOD (ROUTINE X 2)
Culture: NO GROWTH
Culture: NO GROWTH
SPECIAL REQUESTS: ADEQUATE
Special Requests: ADEQUATE

## 2016-12-05 NOTE — Code Documentation (Signed)
  83 y/o male with multiple medical problems recently underwent repair of right hip fracture.   Admitted from SNF with AMS. Was in bed this am and was getting getting to get cleaned up when he went unresponsive.  I responded to Code [place] immediately.   On my arrival patient nonresponsive and mottled with minimal agonal breathing. Incontinent of stool. No palpable pulse. Rhythm sinus brady/junctional. Consistent with PEA arrest.  CPR and bagging begun immediately by nursing staff. I was about to give epinephrine when nursing staff pointed out he was DNR.   At that point all resuscitative efforts were stopped. Patient passed about 10 mins later with asystole.   Wife notified by telephone.   Total CTT 35 mins.   Glori Bickers, MD  9:38 AM

## 2016-12-05 NOTE — Plan of Care (Signed)
Problem: Safety: Goal: Ability to remain free from injury will improve Outcome: Progressing Patient noted with increased confusion and agitation.  He has made several attempts to get out of bed.  Low bed in place, and wife is at bedside.

## 2016-12-05 NOTE — ED Notes (Signed)
Admitting at bedside 

## 2016-12-05 NOTE — ED Notes (Signed)
Report attempted 

## 2016-12-05 NOTE — Progress Notes (Signed)
Pharmacy Antibiotic Note  Frank Horn is a 83 y.o. male admitted on 12/01/2016 with sepsis.  Pharmacy has been consulted for Vancomycin and Zosyn dosing.  Vanc 1500mg  and Zosyn 3.375gm IV in ED ~2300  Plan: Zosyn 3.375gm IV q8h - doses over 4 hours Vancomycin 1gm IV q24h Will f/u micro data, renal function, and pt's clinical condition Vanc trough prn      Temp (24hrs), Avg:98.7 F (37.1 C), Min:98.7 F (37.1 C), Max:98.7 F (37.1 C)   Recent Labs Lab 11/19/16 0514 11/20/16 0525 11/28/2016 2138 11/27/2016 2159 Nov 28, 2016 0025  WBC 8.3 7.7 14.5*  --   --   CREATININE 0.98 0.75 1.17  --   --   LATICACIDVEN  --   --   --  4.03* 3.39*    Estimated Creatinine Clearance: 41.5 mL/min (by C-G formula based on SCr of 1.17 mg/dL).    Allergies  Allergen Reactions  . Lipitor [Atorvastatin]     myalgias    Antimicrobials this admission: 4/21 Vanc >>  4/21 Zosyn >>   Dose adjustments this admission: n/a  Microbiology results: 4/21 BCx x2:  4/21 UCx:    Thank you for allowing pharmacy to be a part of this patient's care.  Sherlon Handing, PharmD, BCPS Clinical pharmacist, pager [phone] November 28, 2016 1:55 AM

## 2016-12-05 NOTE — Discharge Summary (Signed)
Death Summary  Frank Horn QHU:[id number] DOB: April 20, 1927 DOA: Dec 25, 2016  PCP: Frank Crutch, MD  Admit date: Dec 25, 2016 Date of Death: Dec 30, 2016 Time of Death:  Notification: Frank Crutch, MD notified of death of 30-Dec-2016   History of present illness:  Frank Horn is a 83 y.o. male with a history of ischemic and hemorrhagic CVA, dementia, paroxysmal atrial fibrillation, HTN, HLD, recent hip fracture.  [name] presented with complaint of altered mental status.  [name] did not improve after IV fluids and IV antibiotics.  83 yo male admitted to the hospital with the chief complain of altered mental status and hypotension, recent hospitalization 4/14 for right hip fracture, discharged to SNF. The day of admission, patient's family noted her to be less responsive with blood pressure 68/40. Poor oral intake and recurrent falls since discharge. On the initial examination, respiratory rate 25 with blood pressure 113/81. Non focal. CT with fecal impaction. Admitted with the working diagnosis of SIRS to rule out sepsis. Patient was placed on broad spectrum antibiotic therapy with Vancomycin and Zosyn.  Patient with persistent encephalopathy, very confused and agitated. After being turned developed worsening mentation, became unresponsive and went into pulseless electrical activity. Patient with advance directive to do not resuscitate. Patient was allowed to have natural death.    Final Diagnoses:  1.  Sepsis 2.  Occult infection possible aspiration 3.  Dementia with delirium     The results of significant diagnostics from this hospitalization (including imaging, microbiology, ancillary and laboratory) are listed below for reference.    Significant Diagnostic Studies: Ct Abdomen Pelvis Wo Contrast  Result Date: 12/26/16 CLINICAL DATA:  Acute onset of decreased responsiveness and hypotension. Slurred speech. Initial encounter. EXAM: CT ABDOMEN AND PELVIS WITHOUT CONTRAST  TECHNIQUE: Multidetector CT imaging of the abdomen and pelvis was performed following the standard protocol without IV contrast. COMPARISON:  Pelvic radiograph performed 11/18/2016 FINDINGS: Lower chest: Small bilateral pleural effusions are noted, with increased interstitial markings, concerning for mild interstitial edema. Hepatobiliary: A calcified granuloma is noted adjacent to the gallbladder fossa. The liver is otherwise unremarkable. Stones are seen dependently within the gallbladder. The common bile duct remains normal in caliber. Pancreas: The pancreas is within normal limits. Spleen: The spleen is unremarkable in appearance. Adrenals/Urinary Tract: The adrenal glands are unremarkable in appearance. Numerous bilateral renal cysts are seen. There is no evidence of hydronephrosis. No renal or ureteral stones are identified. Nonspecific perinephric stranding is noted bilaterally. Stomach/Bowel: The stomach is unremarkable in appearance. The small bowel is within normal limits. The appendix is normal in caliber, without evidence of appendicitis. It extends into a small right inguinal hernia. Mild diverticulosis is noted along the sigmoid colon, without evidence of diverticulitis. The rectum is distended to 8 cm with stool, concerning for mild fecal impaction. Vascular/Lymphatic: There is aneurysmal dilatation of the infrarenal abdominal aorta to 3.7 cm in AP and transverse dimensions, and to 3.8 cm at the aortic bifurcation, with prominence of the proximal common iliac arteries bilaterally. No retroperitoneal or pelvic sidewall lymphadenopathy is seen. Reproductive: The bladder is mildly distended and grossly unremarkable. The prostate is borderline normal in size, with scattered calcification. Other:  No additional soft tissue abnormalities are seen. Musculoskeletal: Fluid overlying the right hip likely reflects a postoperative seroma. Vague edema and intramuscular hemorrhage are seen tracking about the  patient's right hip arthroplasty. The right hip arthroplasty is incompletely characterized, but appears grossly unremarkable. Facet disease is noted at the lower lumbar spine. IMPRESSION:  1. Vague edema and intramuscular hemorrhage tracking about the patient's right hip arthroplasty. Fluid overlying the right hip likely reflects a postoperative seroma. 2. Rectum distended to 8 cm with stool, concerning for mild fecal impaction. 3. Small bilateral pleural effusions, with increased interstitial markings, concerning for mild pulmonary edema. 4. **An incidental finding of potential clinical significance has been found. Aneurysmal dilatation of the infrarenal abdominal aorta to 3.7 cm in AP and transverse dimensions, and 3.8 cm at the aortic bifurcation, with prominence of the proximal common iliac arteries bilaterally. Recommend followup by ultrasound in 2 years. This recommendation follows ACR consensus guidelines: White Paper of the ACR Incidental Findings Committee II on Vascular Findings. J Am Coll Radiol 2013; 10:789-794.** 5. Mild diverticulosis along the sigmoid colon, without evidence of diverticulitis. 6. Numerous bilateral renal cysts seen. 7. Small right inguinal hernia, containing the appendix. The appendix is unremarkable in appearance. 8. Cholelithiasis noted.  Gallbladder otherwise unremarkable. Electronically Signed   By: Frank Horn M.D.   On: 2016-12-03 02:28   Dg Chest 1 View  Result Date: 11/10/2016 CLINICAL DATA:  Altered mental status EXAM: CHEST 1 VIEW COMPARISON:  11/18/2016, 03/24/2010 FINDINGS: Cardiomegaly. Unfolded aorta. No focal consolidation or effusion. No pneumothorax. IMPRESSION: Cardiomegaly.  No infiltrate or edema. Electronically Signed   By: Frank Horn M.D.   On: 11/12/2016 22:48   Dg Ankle Complete Right  Result Date: 11/18/2016 CLINICAL DATA:  83 year old male with history of trauma from a fall today complaining of right ankle pain. EXAM: RIGHT ANKLE - COMPLETE 3+  VIEW COMPARISON:  No priors. FINDINGS: Multiple views of the right ankle demonstrate no acute displaced fracture, subluxation, dislocation, or soft tissue abnormality. IMPRESSION: No acute radiographic abnormality of the right ankle. Electronically Signed   By: Vinnie Langton M.D.   On: 11/18/2016 12:05   Ct Head Wo Contrast  Result Date: 12/03/2016 CLINICAL DATA:  Status post fall, with acute onset of altered mental status. Initial encounter. EXAM: CT HEAD WITHOUT CONTRAST TECHNIQUE: Contiguous axial images were obtained from the base of the skull through the vertex without intravenous contrast. COMPARISON:  CT of the head performed 11/18/2016, and MRI of the brain performed 05/23/2010 FINDINGS: Brain: No evidence of acute infarction, hemorrhage, hydrocephalus, extra-axial collection or mass lesion/mass effect. Prominence of the ventricles and sulci reflects mild to moderate cortical volume loss. Mild cerebellar atrophy is noted. Scattered periventricular and subcortical white matter change likely reflects small vessel ischemic microangiopathy. Chronic encephalomalacia is noted at the left occipital lobe, reflecting remote infarct. The brainstem and fourth ventricle are within normal limits. The basal ganglia are unremarkable in appearance. No mass effect or midline shift is seen. Vascular: No hyperdense vessel or unexpected calcification. Skull: There is no evidence of fracture; visualized osseous structures are unremarkable in appearance. Sinuses/Orbits: The orbits are within normal limits. The paranasal sinuses and mastoid air cells are well-aerated. Other: No significant soft tissue abnormalities are seen. IMPRESSION: 1. No evidence of traumatic intracranial injury or fracture. 2. Mild to moderate cortical volume loss and scattered small vessel ischemic microangiopathy. 3. Chronic encephalomalacia at the left occipital lobe, reflecting remote infarct. Electronically Signed   By: Frank Horn M.D.   On:  11/16/2016 22:21   Ct Head Wo Contrast  Result Date: 11/18/2016 CLINICAL DATA:  Fall getting out of shower, right hip deformity EXAM: CT HEAD WITHOUT CONTRAST TECHNIQUE: Contiguous axial images were obtained from the base of the skull through the vertex without intravenous contrast. COMPARISON:  03/27/2010 FINDINGS: Brain:  No intracranial hemorrhage, mass effect or midline shift. Moderate cerebral atrophy. There is periventricular an extensive patchy subcortical white matter decreased attenuation probable due to chronic small vessel ischemic changes. Small lacunar infarct in left basal ganglia. Stable encephalomalacia sequela from prior hemorrhage in left posterior parietal lobe and occipital lobe. No definite acute cortical infarction. No mass lesion is noted on this unenhanced scan. Vascular: Mild atherosclerotic calcifications of carotid siphon Skull: No skull fracture is noted. Sinuses/Orbits: No acute findings Other: None IMPRESSION: No acute intracranial abnormality. Stable cerebral atrophy. Moderate periventricular and patchy subcortical white matter decreased attenuation probable due to chronic small vessel ischemic changes. There is encephalomalacia sequela from prior hemorrhage in left posterior parietal and left occipital lobe. No definite acute cortical infarction. Electronically Signed   By: Lahoma Crocker M.D.   On: 11/18/2016 14:55   Pelvis Portable  Result Date: 11/18/2016 CLINICAL DATA:  Right hip arthroplasty. EXAM: PORTABLE PELVIS 1-2 VIEWS COMPARISON:  None. FINDINGS: Single AP view pelvis demonstrates right hip arthroplasty, without acute hardware complication. Mild to moderate left hip osteoarthritis. Surgical staples superficial to the joint. IMPRESSION: Expected appearance after right hip arthroplasty. Electronically Signed   By: Abigail Miyamoto M.D.   On: 11/18/2016 18:50   Dg Chest Port 1 View  Result Date: 11/18/2016 CLINICAL DATA:  Preoperative examination prior to hip surgery. EXAM:  PORTABLE CHEST 1 VIEW COMPARISON:  03/24/2010 FINDINGS: Cardiomegaly identified. There is no evidence of focal airspace disease, pulmonary edema, suspicious pulmonary nodule/mass, pleural effusion, or pneumothorax. No acute bony abnormalities are identified. IMPRESSION: Cardiomegaly without evidence of acute cardiopulmonary disease. Electronically Signed   By: Margarette Canada M.D.   On: 11/18/2016 13:10   Dg C-arm 1-60 Min-no Report  Result Date: 11/18/2016 Fluoroscopy was utilized by the requesting physician.  No radiographic interpretation.   Dg Hip Operative Unilat W Or W/o Pelvis Right  Result Date: 11/18/2016 CLINICAL DATA:  Right hip replacement common known right hip fracture EXAM: OPERATIVE RIGHT HIP WITH PELVIS COMPARISON:  11/18/2016 FLUOROSCOPY TIME:  Radiation Exposure Index (as provided by the fluoroscopic device): 0.72 mGy If the device does not provide the exposure index: Fluoroscopy Time:  10.6 seconds Number of Acquired Images:  4 FINDINGS: Right hip fracture is again seen on the initial images. Right hip prosthesis is subsequent shown in satisfactory position. No acute abnormality noted. IMPRESSION: Status post right hip replacement Electronically Signed   By: Inez Catalina M.D.   On: 11/18/2016 18:46   Dg Hip Unilat  With Pelvis 2-3 Views Right  Result Date: 11/18/2016 CLINICAL DATA:  83 year old male with history of trauma from a fall at home today complaining of pain in the right hip. EXAM: DG HIP (WITH OR WITHOUT PELVIS) 2-3V RIGHT COMPARISON:  No priors. FINDINGS: Multiple views of the pelvis and right hip demonstrate a transcervical right femoral neck fracture which appears foreshortened and angulated approximately 45 degrees. Right femoral head remains located. Bony pelvis appears intact. Left proximal femur appears intact as visualized. IMPRESSION: 1. Acute right-sided angulated transcervical femoral neck fracture, as above. Electronically Signed   By: Vinnie Langton M.D.   On:  11/18/2016 12:04    Microbiology: Recent Results (from the past 240 hour(s))  Urine culture     Status: None   Collection Time: 11/07/2016  9:26 PM  Result Value Ref Range Status   Specimen Description URINE, CATHETERIZED  Final   Special Requests NONE  Final   Culture NO GROWTH  Final   Report Status 11/27/2016  FINAL  Final  Culture, blood (Routine x 2)     Status: None   Collection Time: 11/14/2016  9:41 PM  Result Value Ref Range Status   Specimen Description BLOOD RIGHT ANTECUBITAL  Final   Special Requests   Final    BOTTLES DRAWN AEROBIC AND ANAEROBIC Blood Culture adequate volume   Culture NO GROWTH 5 DAYS  Final   Report Status 11/30/2016 FINAL  Final  Culture, blood (Routine x 2)     Status: None   Collection Time: 11/05/2016  9:46 PM  Result Value Ref Range Status   Specimen Description BLOOD BLOOD LEFT FOREARM  Final   Special Requests   Final    BOTTLES DRAWN AEROBIC AND ANAEROBIC Blood Culture adequate volume   Culture NO GROWTH 5 DAYS  Final   Report Status 11/30/2016 FINAL  Final  MRSA PCR Screening     Status: None   Collection Time: 17-Dec-2016  3:18 AM  Result Value Ref Range Status   MRSA by PCR NEGATIVE NEGATIVE Final    Comment:        The GeneXpert MRSA Assay (FDA approved for NASAL specimens only), is one component of a comprehensive MRSA colonization surveillance program. It is not intended to diagnose MRSA infection nor to guide or monitor treatment for MRSA infections.      Labs: Basic Metabolic Panel:  Recent Labs Lab 11/05/2016 2138 12-17-2016 0708  NA 147* 144  K 3.2* 3.2*  CL 108 112*  CO2 25 21*  GLUCOSE 152* 142*  BUN 28* 28*  CREATININE 1.17 1.12  CALCIUM 8.9 7.7*   Liver Function Tests:  Recent Labs Lab 11/14/2016 2138  AST 75*  ALT 41  ALKPHOS 116  BILITOT 1.3*  PROT 6.1*  ALBUMIN 3.3*   No results for input(s): LIPASE, AMYLASE in the last 168 hours. No results for input(s): AMMONIA in the last 168 hours. CBC:  Recent  Labs Lab 11/08/2016 2138 12/17/16 0708  WBC 14.5* 12.2*  NEUTROABS 12.6*  --   HGB 8.8* 9.1*  HCT 27.1* 28.9*  MCV 92.8 94.1  PLT 294 238   Cardiac Enzymes: No results for input(s): CKTOTAL, CKMB, CKMBINDEX, TROPONINI in the last 168 hours. D-Dimer No results for input(s): DDIMER in the last 72 hours. BNP: Invalid input(s): POCBNP CBG: No results for input(s): GLUCAP in the last 168 hours. Anemia work up No results for input(s): VITAMINB12, FOLATE, FERRITIN, TIBC, IRON, RETICCTPCT in the last 72 hours. Urinalysis    Component Value Date/Time   COLORURINE AMBER (A) 11/12/2016 2126   APPEARANCEUR HAZY (A) 11/24/2016 2126   LABSPEC 1.021 11/11/2016 2126   PHURINE 5.0 11/13/2016 2126   GLUCOSEU NEGATIVE 11/23/2016 2126   HGBUR SMALL (A) 11/13/2016 2126   BILIRUBINUR NEGATIVE 11/28/2016 2126   KETONESUR NEGATIVE 11/21/2016 2126   PROTEINUR 100 (A) 11/06/2016 2126   UROBILINOGEN 0.2 03/24/2010 1953   NITRITE NEGATIVE 11/09/2016 2126   LEUKOCYTESUR NEGATIVE 11/14/2016 2126   Sepsis Labs Invalid input(s): PROCALCITONIN,  WBC,  LACTICIDVEN     SIGNED:  Tawni Millers, MD  [facility] 11/30/2016, 3:52 PM Pager   If 7PM-7AM, please contact night-coverage [email or web address] Password [email or web address]

## 2016-12-05 NOTE — Progress Notes (Signed)
At approximately 0900 While in room with care partners to clean pt after bowel incontinence episode this RN noticed pt go from talking and flailing at staff to being totally limp and unresponsive to voice, touch and sternal rub. No pulse palpated. Code Blue called and chest compressions initiated. Upon code team arrival this RN recalled pt was in fact a DNR. Confirmed in medical record and resuscitation attempts stopped. Wife informed. Attending informed. Post mortem care completed once time of death confirmed. Wife arrived at 50. Disposition pending decision by family once daughter arrives.

## 2016-12-05 NOTE — H&P (Signed)
History and Physical    Frank Horn UEA:[id number] DOB: 1926-12-12 DOA: 11/18/2016  PCP: Melinda Crutch, MD   Patient coming from: SNF  Chief Complaint: Altered mental status.  Reported hypotension.  HPI: Frank Horn is a 83 y.o. gentleman with a history of prior CVA (ischemic and hemorrhagic), paroxysmal atrial fibrillation (CHADS-Vasc score of 4, no anticoagulation due to history of hemorrhagic CVA), HTN, HLD, dementia, and recent admission for fall resulting in hip fracture and requiring right hemiarthroplasty on 4/14.  He has been at [facility] since Monday.  Since discharge, he has had problems with insomnia, decreased PO intake, and recurrent falls because he tries to get out of bed without assistance.  Today, family was notified of a decreased level of responsiveness and hypotension (BP 68/40).  However, when EMS arrived to assess the patient, normal BP was documented.  He was brought to the ED for further evaluation.  He did not seem to recognize his daughter this evening, which is atypical.  He has a chronic dry cough.  Family reports intermittent signs of aspiration.  They are not sure if he is getting assistance with all meals at SNF.  He is not on a modified diet either.  No diarrhea.  No gross bleeding.  However, he has extensive bruising on his right side since surgery.  ED Course: Lactic acid level 4.  WBC count 14.5.  Hgb 8.8 (12.6 one week ago).  Sodium 147.  Potassium 3.2.  BUN 28.  Creatinine 1.17.  Chest xray shows cardiomegaly but no acute process.  Head CT negative for acute process.  Urine does not appear infected.  Sepsis protocol activated.  The patient received 30cc/kg of NS.  He received vanc and zosyn.  Blood cultures are pending.  Hospitalist asked to admit.  STAT CT abdomen and pelvis ordered.  Review of Systems: Unable to obtain from the patient due to mental status changes.   Past Medical History:  Diagnosis Date  . Atrial fibrillation ([place])   . Closed right hip  fracture, initial encounter ([place]) 11/2016  . History of CVA (cerebrovascular accident)   . Hyperlipidemia   . Hypertension   . Hypokalemia   . PAF (paroxysmal atrial fibrillation) ([place])   . Polymyalgia ([place])    resolved  . Stroke ([place])   . Unwitnessed fall     Past Surgical History:  Procedure Laterality Date  . HEMORRHOID SURGERY    . TOTAL HIP ARTHROPLASTY Right 11/18/2016   Procedure: BIPOLAR HIP ARTHROPLASTY ANTERIOR APPROACH;  Surgeon: Mcarthur Rossetti, MD;  Location: [facility] ORS;  Service: Orthopedics;  Laterality: Right;  . TOTAL KNEE ARTHROPLASTY Right 2011     reports that he quit smoking about 7 years ago. His smoking use included Cigarettes. He has never used smokeless tobacco. He reports that he drinks alcohol. He reports that he does not use drugs.  Allergies  Allergen Reactions  . Lipitor [Atorvastatin]     myalgias    Family History  Problem Relation Age of Onset  . Heart disease Mother   . Aneurysm Brother      Prior to Admission medications   Medication Sig Start Date End Date Taking? Authorizing Provider  acetaminophen (TYLENOL) 325 MG tablet Take 2 tablets (650 mg total) by mouth every 6 (six) hours as needed for mild pain (or Fever >/= 101). 11/20/16  Yes [place], DO  aspirin EC 325 MG EC tablet Take 1 tablet (325 mg total) by mouth daily with breakfast. 11/20/16  Yes Mcarthur Rossetti, MD  docusate sodium (COLACE) 100 MG capsule Take 1 capsule (100 mg total) by mouth 2 (two) times daily. 11/20/16  Yes [place], DO  HYDROcodone-acetaminophen (NORCO/VICODIN) 5-325 MG tablet Take 1-2 tablets by mouth every 6 (six) hours as needed for moderate pain. 11/20/16  Yes [place], DO  polyethylene glycol (MIRALAX / GLYCOLAX) packet Take 17 g by mouth daily as needed for mild constipation. 11/20/16  Yes [place], DO  QUEtiapine (SEROQUEL) 25 MG tablet Take 12.5 mg by mouth at bedtime.   Yes Historical Provider, MD  traZODone  (DESYREL) 50 MG tablet Take 50 mg by mouth at bedtime. Take 1/2 tablet to = 25 mg qhs   Yes Historical Provider, MD    Physical Exam: Vitals:   12/04/2016 2330 2016-12-21 0030 12-21-2016 0100 12-21-2016 0110  BP: 113/81 111/87 114/87   Pulse:      Resp: (!) 24 18 (!) 25   Temp:      TempSrc:      SpO2:    95%      Constitutional: NAD, restless in bed, chronically ill appearing Vitals:   11/06/2016 2330 Dec 21, 2016 0030 2016/12/21 0100 21-Dec-2016 0110  BP: 113/81 111/87 114/87   Pulse:      Resp: (!) 24 18 (!) 25   Temp:      TempSrc:      SpO2:    95%   Eyes: PERRL, lids and conjunctivae normal ENMT: Mucous membranes are dry. Posterior pharynx clear of any exudate or lesions. Neck: normal appearance, supple, no masses Respiratory: Coarse bilaterally with intermittent wheezing.  Normal respiratory effort. No accessory muscle use.  Cardiovascular: Normal rate, regular rhythm, no murmurs / rubs / gallops. No extremity edema. 2+ pedal pulses. GI: abdomen is soft and compressible.  No distention.  Some guarding with palpation to LLQ.  Bowel sounds are present. Musculoskeletal:  No joint deformity in upper and lower extremities. Good ROM, no contractures. Normal muscle tone.  Skin: Extensive hemorrhage to right flank, right hip with extension into groin, linear marks on left hip that look like scratches.  No rash.  Pale, cool, dry. Neurologic: Limited by mental status changes; no apparent focal deficits. Psychiatric: Disoriented, Impaired judgment, insight.      Labs on Admission: I have personally reviewed following labs and imaging studies  CBC:  Recent Labs Lab 11/19/16 0514 11/20/16 0525 11/30/2016 2138  WBC 8.3 7.7 14.5*  NEUTROABS  --  5.8 12.6*  HGB 9.9* 9.1* 8.8*  HCT 29.1* 27.0* 27.1*  MCV 90.7 90.6 92.8  PLT 157 137* 272   Basic Metabolic Panel:  Recent Labs Lab 11/19/16 0514 11/20/16 0525 11/15/2016 2138  NA 141 138 147*  K 4.0 3.7 3.2*  CL 105 106 108  CO2 28 26 25     GLUCOSE 138* 116* 152*  BUN 11 11 28*  CREATININE 0.98 0.75 1.17  CALCIUM 8.0* 7.8* 8.9  MG  --  1.7  --   PHOS  --  1.5*  --    GFR: Estimated Creatinine Clearance: 41.5 mL/min (by C-G formula based on SCr of 1.17 mg/dL). Liver Function Tests:  Recent Labs Lab 11/20/16 0525 11/15/2016 2138  AST 49* 75*  ALT 14* 41  ALKPHOS 68 116  BILITOT 0.9 1.3*  PROT 5.1* 6.1*  ALBUMIN 2.7* 3.3*   Coagulation Profile:  Recent Labs Lab 11/27/2016 2138  INR 1.40   Anemia Panel:  Recent Labs  Dec 21, 2016 0016  VITAMINB12  301  FOLATE 14.1  FERRITIN 314  TIBC 260  IRON 49  RETICCTPCT 4.1*   Urine analysis:    Component Value Date/Time   COLORURINE AMBER (A) 11/24/2016 2126   APPEARANCEUR HAZY (A) 11/14/2016 2126   LABSPEC 1.021 11/17/2016 2126   PHURINE 5.0 11/07/2016 2126   GLUCOSEU NEGATIVE 11/11/2016 2126   HGBUR SMALL (A) 11/16/2016 2126   BILIRUBINUR NEGATIVE 11/28/2016 2126   KETONESUR NEGATIVE 11/05/2016 2126   PROTEINUR 100 (A) 11/27/2016 2126   UROBILINOGEN 0.2 03/24/2010 1953   NITRITE NEGATIVE 11/11/2016 2126   LEUKOCYTESUR NEGATIVE 11/14/2016 2126   Sepsis Labs:  Lactic acid level 4  Radiological Exams on Admission: Ct Abdomen Pelvis Wo Contrast  Result Date: 12-09-2016 CLINICAL DATA:  Acute onset of decreased responsiveness and hypotension. Slurred speech. Initial encounter. EXAM: CT ABDOMEN AND PELVIS WITHOUT CONTRAST TECHNIQUE: Multidetector CT imaging of the abdomen and pelvis was performed following the standard protocol without IV contrast. COMPARISON:  Pelvic radiograph performed 11/18/2016 FINDINGS: Lower chest: Small bilateral pleural effusions are noted, with increased interstitial markings, concerning for mild interstitial edema. Hepatobiliary: A calcified granuloma is noted adjacent to the gallbladder fossa. The liver is otherwise unremarkable. Stones are seen dependently within the gallbladder. The common bile duct remains normal in caliber. Pancreas:  The pancreas is within normal limits. Spleen: The spleen is unremarkable in appearance. Adrenals/Urinary Tract: The adrenal glands are unremarkable in appearance. Numerous bilateral renal cysts are seen. There is no evidence of hydronephrosis. No renal or ureteral stones are identified. Nonspecific perinephric stranding is noted bilaterally. Stomach/Bowel: The stomach is unremarkable in appearance. The small bowel is within normal limits. The appendix is normal in caliber, without evidence of appendicitis. It extends into a small right inguinal hernia. Mild diverticulosis is noted along the sigmoid colon, without evidence of diverticulitis. The rectum is distended to 8 cm with stool, concerning for mild fecal impaction. Vascular/Lymphatic: There is aneurysmal dilatation of the infrarenal abdominal aorta to 3.7 cm in AP and transverse dimensions, and to 3.8 cm at the aortic bifurcation, with prominence of the proximal common iliac arteries bilaterally. No retroperitoneal or pelvic sidewall lymphadenopathy is seen. Reproductive: The bladder is mildly distended and grossly unremarkable. The prostate is borderline normal in size, with scattered calcification. Other:  No additional soft tissue abnormalities are seen. Musculoskeletal: Fluid overlying the right hip likely reflects a postoperative seroma. Vague edema and intramuscular hemorrhage are seen tracking about the patient's right hip arthroplasty. The right hip arthroplasty is incompletely characterized, but appears grossly unremarkable. Facet disease is noted at the lower lumbar spine. IMPRESSION: 1. Vague edema and intramuscular hemorrhage tracking about the patient's right hip arthroplasty. Fluid overlying the right hip likely reflects a postoperative seroma. 2. Rectum distended to 8 cm with stool, concerning for mild fecal impaction. 3. Small bilateral pleural effusions, with increased interstitial markings, concerning for mild pulmonary edema. 4. **An  incidental finding of potential clinical significance has been found. Aneurysmal dilatation of the infrarenal abdominal aorta to 3.7 cm in AP and transverse dimensions, and 3.8 cm at the aortic bifurcation, with prominence of the proximal common iliac arteries bilaterally. Recommend followup by ultrasound in 2 years. This recommendation follows ACR consensus guidelines: White Paper of the ACR Incidental Findings Committee II on Vascular Findings. J Am Coll Radiol 2013; 10:789-794.** 5. Mild diverticulosis along the sigmoid colon, without evidence of diverticulitis. 6. Numerous bilateral renal cysts seen. 7. Small right inguinal hernia, containing the appendix. The appendix is unremarkable in appearance. 8. Cholelithiasis  noted.  Gallbladder otherwise unremarkable. Electronically Signed   By: Garald Balding M.D.   On: 12-12-16 02:28   Dg Chest 1 View  Result Date: 12/04/2016 CLINICAL DATA:  Altered mental status EXAM: CHEST 1 VIEW COMPARISON:  11/18/2016, 03/24/2010 FINDINGS: Cardiomegaly. Unfolded aorta. No focal consolidation or effusion. No pneumothorax. IMPRESSION: Cardiomegaly.  No infiltrate or edema. Electronically Signed   By: Donavan Foil M.D.   On: 11/21/2016 22:48   Ct Head Wo Contrast  Result Date: 11/15/2016 CLINICAL DATA:  Status post fall, with acute onset of altered mental status. Initial encounter. EXAM: CT HEAD WITHOUT CONTRAST TECHNIQUE: Contiguous axial images were obtained from the base of the skull through the vertex without intravenous contrast. COMPARISON:  CT of the head performed 11/18/2016, and MRI of the brain performed 05/23/2010 FINDINGS: Brain: No evidence of acute infarction, hemorrhage, hydrocephalus, extra-axial collection or mass lesion/mass effect. Prominence of the ventricles and sulci reflects mild to moderate cortical volume loss. Mild cerebellar atrophy is noted. Scattered periventricular and subcortical white matter change likely reflects small vessel ischemic  microangiopathy. Chronic encephalomalacia is noted at the left occipital lobe, reflecting remote infarct. The brainstem and fourth ventricle are within normal limits. The basal ganglia are unremarkable in appearance. No mass effect or midline shift is seen. Vascular: No hyperdense vessel or unexpected calcification. Skull: There is no evidence of fracture; visualized osseous structures are unremarkable in appearance. Sinuses/Orbits: The orbits are within normal limits. The paranasal sinuses and mastoid air cells are well-aerated. Other: No significant soft tissue abnormalities are seen. IMPRESSION: 1. No evidence of traumatic intracranial injury or fracture. 2. Mild to moderate cortical volume loss and scattered small vessel ischemic microangiopathy. 3. Chronic encephalomalacia at the left occipital lobe, reflecting remote infarct. Electronically Signed   By: Garald Balding M.D.   On: 11/29/2016 22:21    Assessment/Plan Active Problems:   Essential hypertension   Cerebral artery occlusion with cerebral infarction (HCC)   AF (paroxysmal atrial fibrillation) (HCC)   Sepsis (HCC)   Acute metabolic encephalopathy   Anemia      Signs and symptoms concerning for sepsis; source of infection unclear.  STAT CT shows probable postoperative seroma as well as intramuscular hemorrhage.  Diverticulosis but no diverticulitis.  Rectal distention suggestive of fecal impaction.  --Can likely D/C antibiotics after 24 hours if no evidence of infectious source --Elevated lactic acid could be related to hemorrhage; screen for occult GI losses --Blood cultures pending  Fecal impaction --Can certainly contribute to restlessness, mental status changes --Bowel regimen senna two tabs BID --tap water enema now; may need disimpaction  Anemia --Anemia panel not particularly helpful --Stool occult requested --Monitor for transfusion requirement  Aspiration risk --Speech consult  Hypokalemia --Replacement  ordered  Hip pain --Analgesics as needed  Afib --Full strength aspirin  Incidental finding of AAA --Will need surveillance  DVT prophylaxis: SCDs Code Status: DNR/DNI Family Communication: Wife and daughter at bedside. Disposition Plan: Expect he will go back to SNF when ready to discharge. Consults called: NONE Admission status: Inpatient, stepdown unit.  I expect this patient will need inpatient services for greater than two midnights.   TIME SPENT: 70 minutes   Eber Jones MD [facility] Pager [phone]  If 7PM-7AM, please contact night-coverage [email or web address] Password [email or web address]  12-Dec-2016, 1:55 AM

## 2016-12-05 DEATH — deceased

## 2016-12-08 ENCOUNTER — Inpatient Hospital Stay (INDEPENDENT_AMBULATORY_CARE_PROVIDER_SITE_OTHER): Payer: Medicare Other | Admitting: Physician Assistant

## 2017-03-12 ENCOUNTER — Other Ambulatory Visit (INDEPENDENT_AMBULATORY_CARE_PROVIDER_SITE_OTHER): Payer: Self-pay | Admitting: Orthopaedic Surgery

## 2019-04-05 IMAGING — CT CT HEAD W/O CM
4 series · 26 of 47 positions shown, 30 images · non-contrast
Comparison: CT of the head performed 11/18/2016, and MRI of the
brain performed 05/23/2010

CLINICAL DATA: Status post fall, with acute onset of altered mental
status. Initial encounter.

EXAM:
CT HEAD WITHOUT CONTRAST
TECHNIQUE: Contiguous axial images were obtained from the base of the skull
through the vertex without intravenous contrast.

[Series 205: head w/o bone, idose (1) · axial · non-contrast · 0.65mm/px · z∈[+81,+181]mm · 7 of 30 slices shown]
[im 3/30  bone]
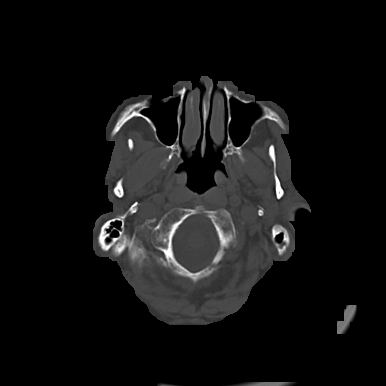
[im 7/30  bone]
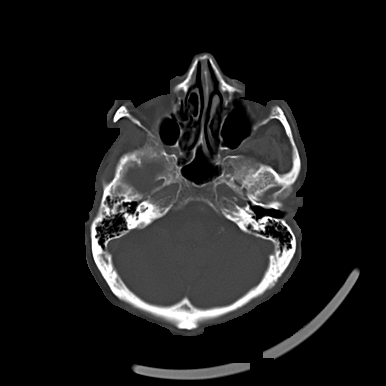
[im 11/30  bone]
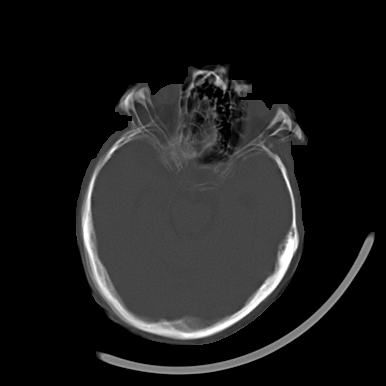
[im 13/30  bone]
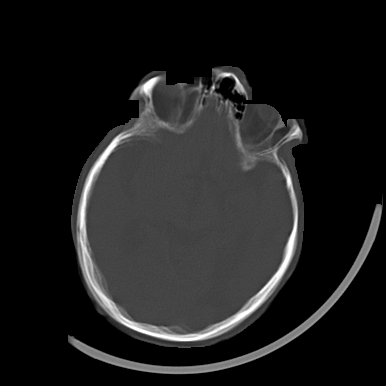
[im 17/30  bone]
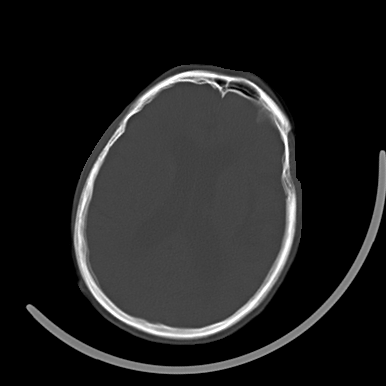
[im 19/30  bone]
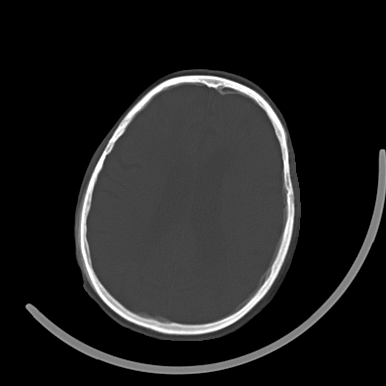
[im 23/30  bone]
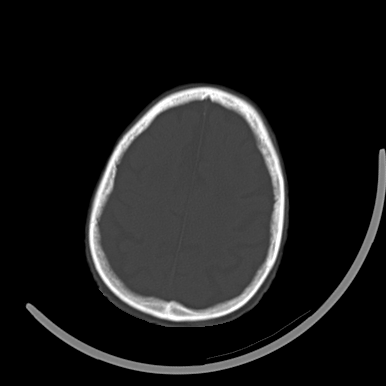

[Series 206: head w/o, idose (1) · axial · non-contrast · 0.65mm/px · z∈[+80,+200]mm · 13 of 30 slices shown, 17 images]
[im 3/30  brain]
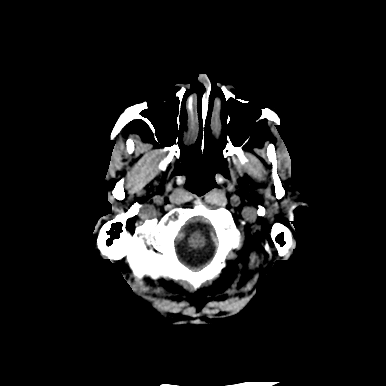
[im 3/30  bone]
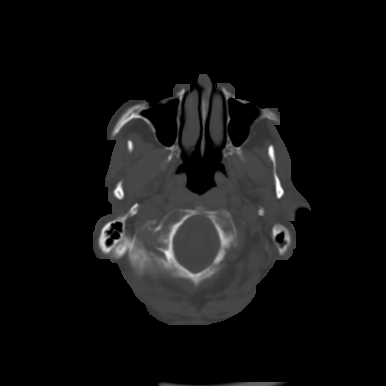
[im 5/30  brain]
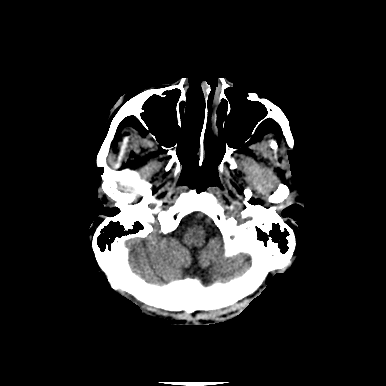
[im 7/30  brain]
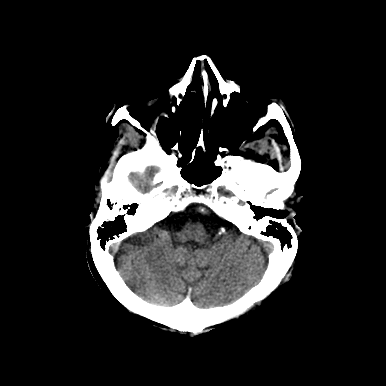
[im 9/30  brain]
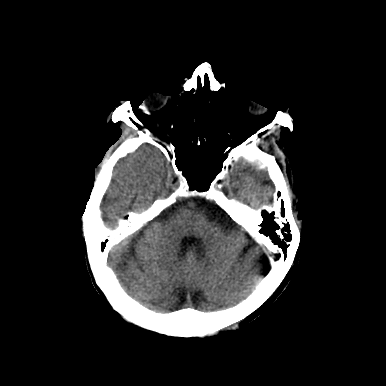
[im 11/30  brain]
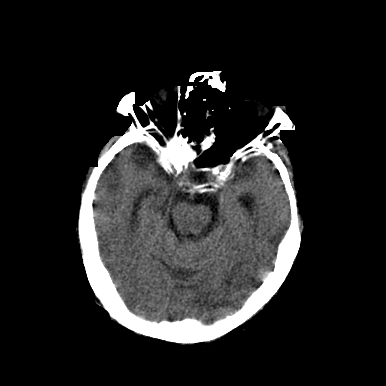
[im 11/30  bone]
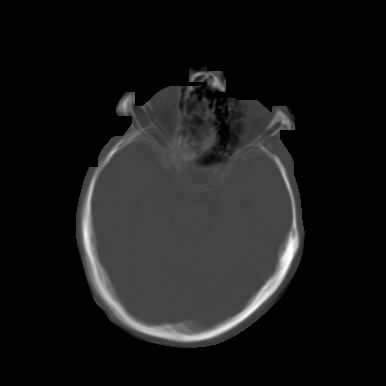
[im 13/30  brain]
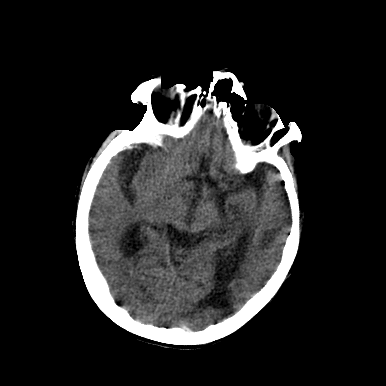
[im 15/30  brain]
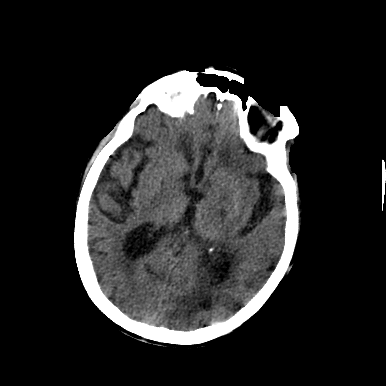
[im 17/30  brain]
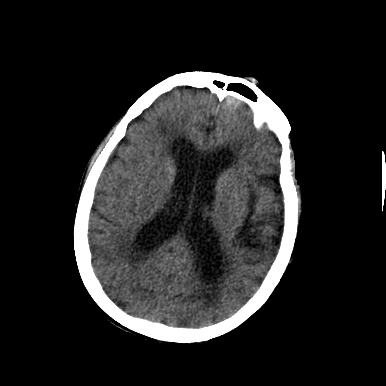
[im 19/30  brain]
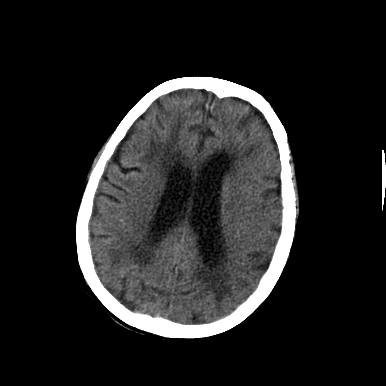
[im 19/30  bone]
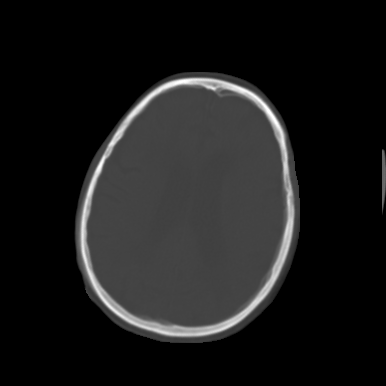
[im 21/30  brain]
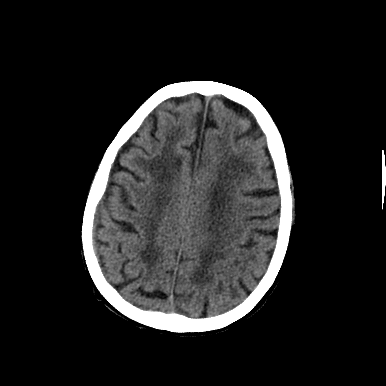
[im 23/30  brain]
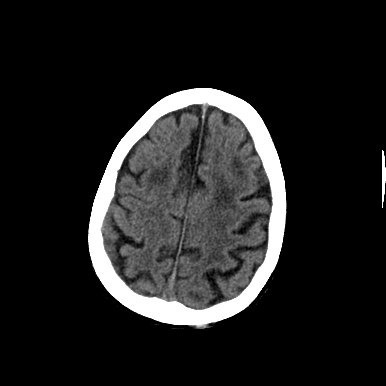
[im 25/30  brain]
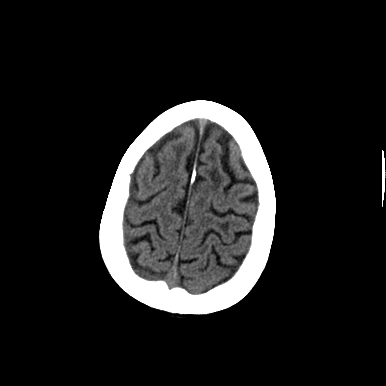
[im 27/30  brain]
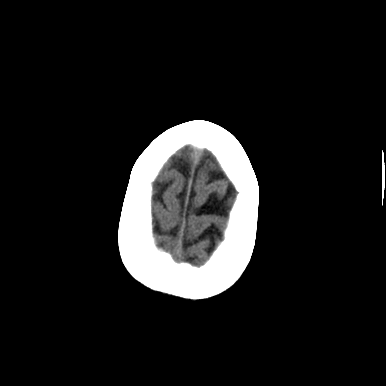
[im 27/30  bone]
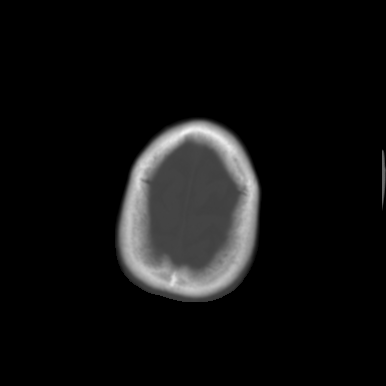

[Series 207: coronal st, idose (1) · coronal · 0.40mm/px · 3 of 74 slices shown]
[im 25/74  brain]
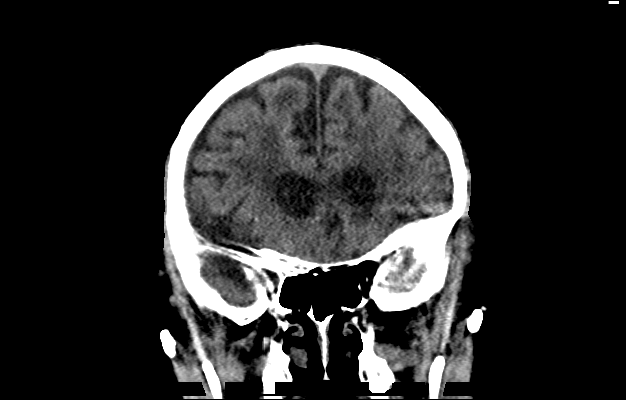
[im 33/74  brain]
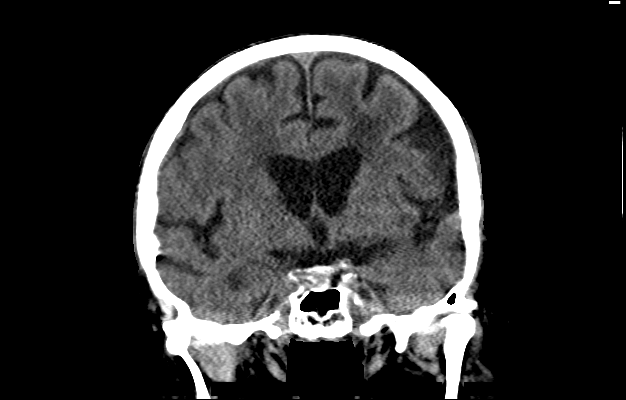
[im 41/74  brain]
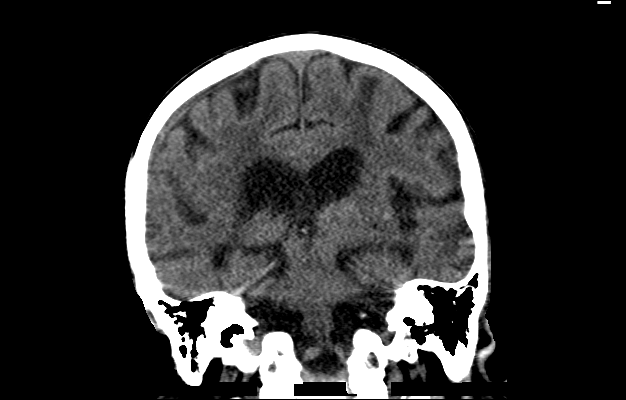

[Series 208: sagittal st, idose (1) · sagittal · 0.40mm/px · 3 of 57 slices shown]
[im 19/57  brain]
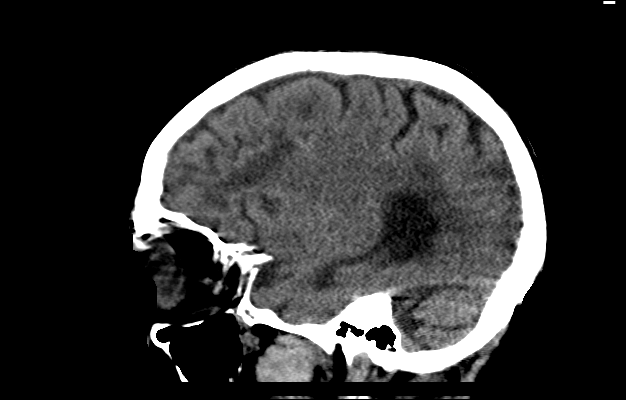
[im 29/57  brain]
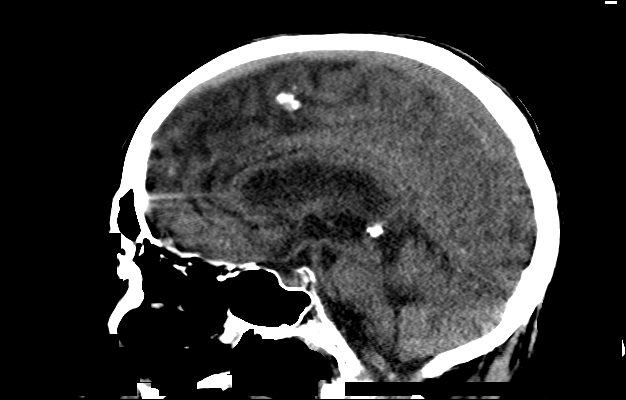
[im 38/57  brain]
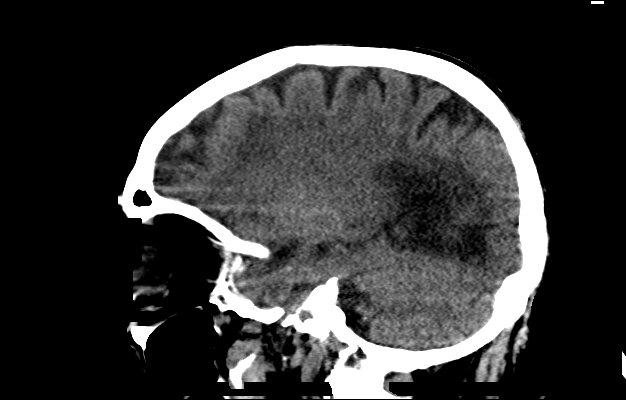

[26 of 47 positions shown; findings below may reference images not displayed]

FINDINGS: Brain: No evidence of acute infarction, hemorrhage, hydrocephalus,
extra-axial collection or mass lesion/mass effect.

Prominence of the ventricles and sulci reflects mild to moderate
cortical volume loss. Mild cerebellar atrophy is noted. Scattered
periventricular and subcortical white matter change likely reflects
small vessel ischemic microangiopathy. Chronic encephalomalacia is
noted at the left occipital lobe, reflecting remote infarct.

The brainstem and fourth ventricle are within normal limits. The
basal ganglia are unremarkable in appearance. No mass effect or
midline shift is seen.

Vascular: No hyperdense vessel or unexpected calcification.

Skull: There is no evidence of fracture; visualized osseous
structures are unremarkable in appearance.

Sinuses/Orbits: The orbits are within normal limits. The paranasal
sinuses and mastoid air cells are well-aerated.

Other: No significant soft tissue abnormalities are seen.
IMPRESSION: 1. No evidence of traumatic intracranial injury or fracture.
2. Mild to moderate cortical volume loss and scattered small vessel
ischemic microangiopathy.
3. Chronic encephalomalacia at the left occipital lobe, reflecting
remote infarct.
